# Patient Record
Sex: Male | Born: 1975 | Race: White | Hispanic: No | Marital: Single | Smoking: Never smoker
Health system: Southern US, Community
[De-identification: ages and names within clinical notes are randomized; demographics above are authoritative.]

## PROBLEM LIST (undated history)

## (undated) DIAGNOSIS — F419 Anxiety disorder, unspecified: Secondary | ICD-10-CM

## (undated) DIAGNOSIS — F32A Depression, unspecified: Secondary | ICD-10-CM

## (undated) DIAGNOSIS — I2699 Other pulmonary embolism without acute cor pulmonale: Secondary | ICD-10-CM

## (undated) DIAGNOSIS — F329 Major depressive disorder, single episode, unspecified: Secondary | ICD-10-CM

## (undated) HISTORY — DX: Anxiety disorder, unspecified: F41.9

## (undated) HISTORY — DX: Major depressive disorder, single episode, unspecified: F32.9

## (undated) HISTORY — DX: Depression, unspecified: F32.A

## (undated) HISTORY — DX: Other pulmonary embolism without acute cor pulmonale: I26.99

---

## 2010-09-07 ENCOUNTER — Emergency Department: Payer: Self-pay | Admitting: Unknown Physician Specialty

## 2011-02-19 ENCOUNTER — Encounter: Payer: Self-pay | Admitting: "Endocrinology

## 2011-03-06 ENCOUNTER — Encounter: Payer: Self-pay | Admitting: "Endocrinology

## 2011-04-05 ENCOUNTER — Encounter: Payer: Self-pay | Admitting: "Endocrinology

## 2012-06-11 ENCOUNTER — Ambulatory Visit: Payer: Self-pay | Admitting: Adult Health

## 2014-01-08 IMAGING — US US EXTREM LOW VENOUS*L*
1 series · 14 of 24 positions shown · non-contrast
Comparison: none

REASON FOR EXAM: STAT CallReport [id number]   LT calf pain eval DVT
COMMENTS:

[Series 1: us extrem low venous*left* · 0.13mm/px · 14 of 25 slices shown]
[im 1/25]
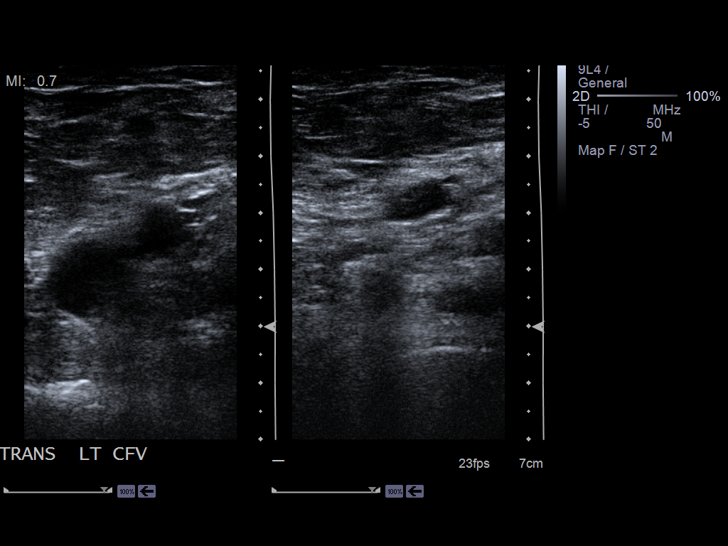
[im 3/25]
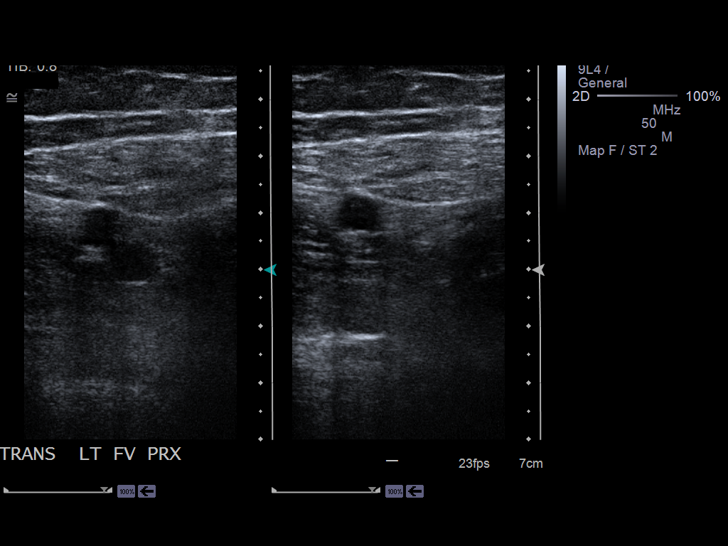
[im 5/25]
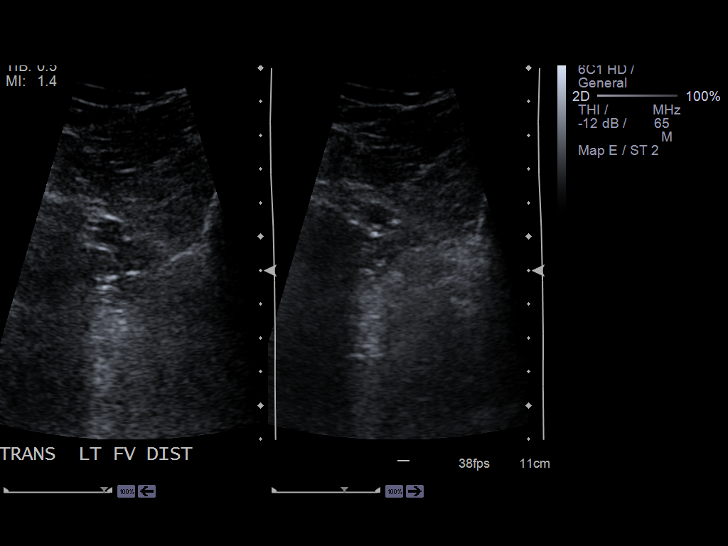
[im 7/25]
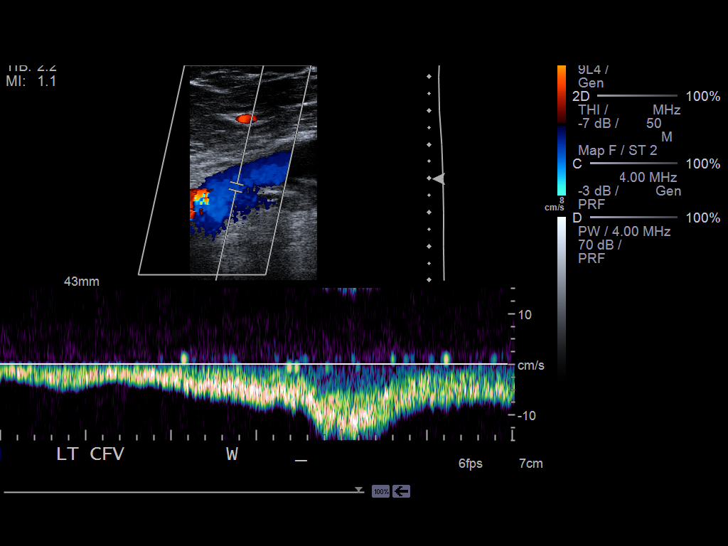
[im 8/25]
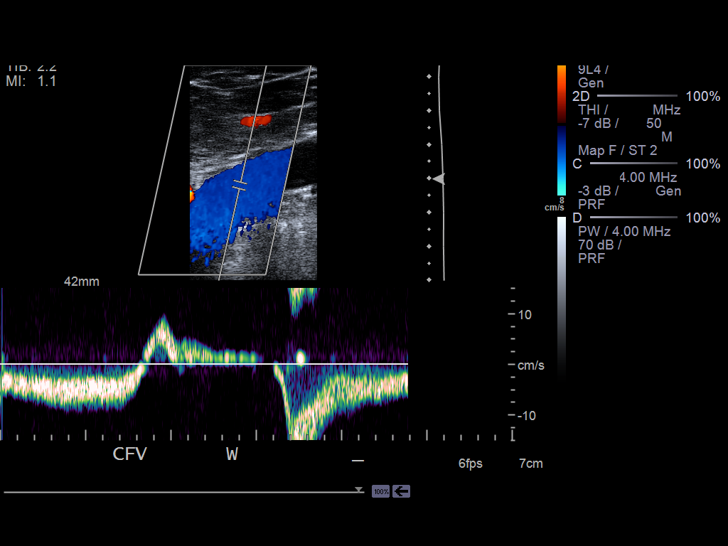
[im 10/25]
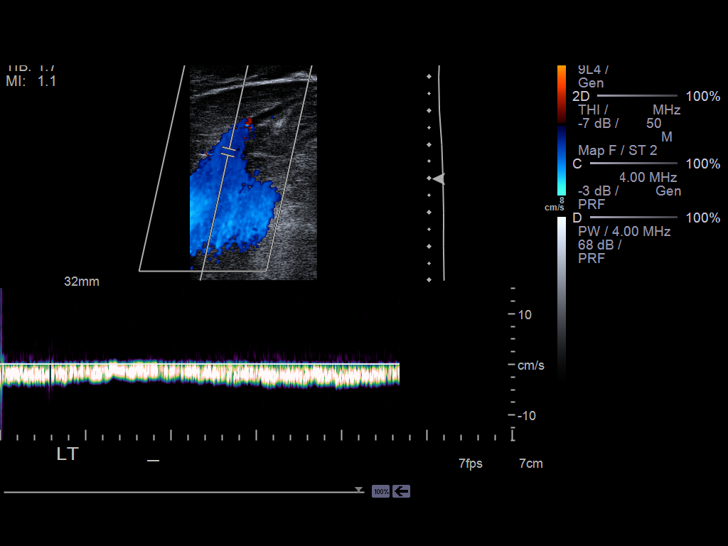
[im 12/25]
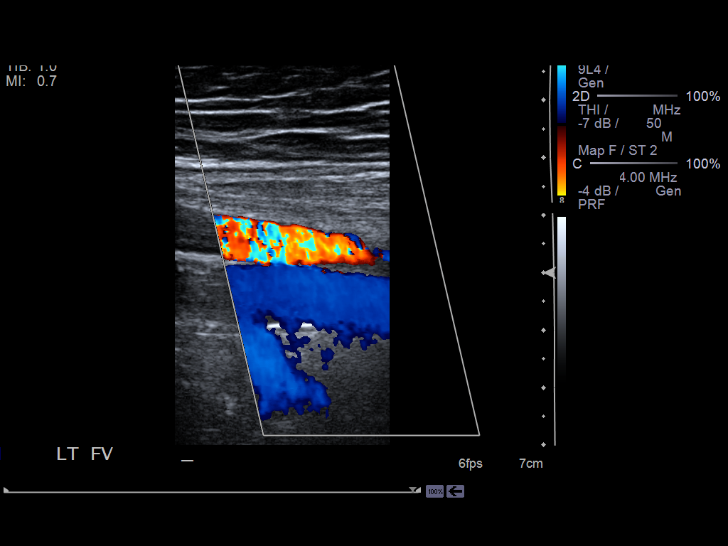
[im 13/25]
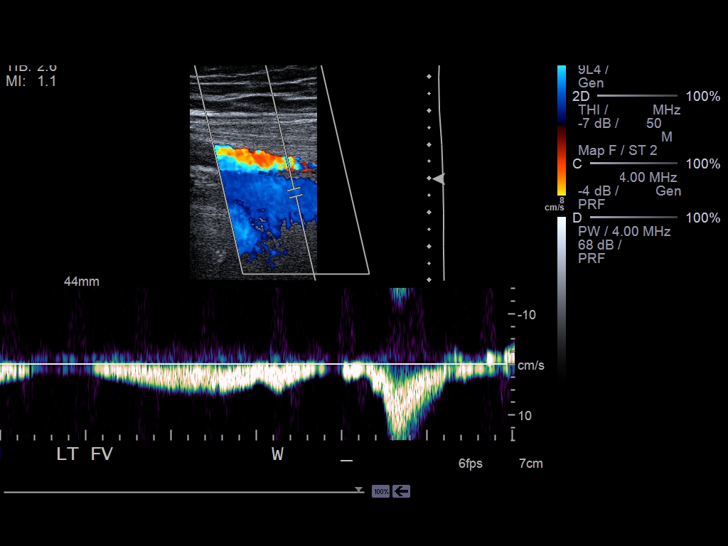
[im 15/25]
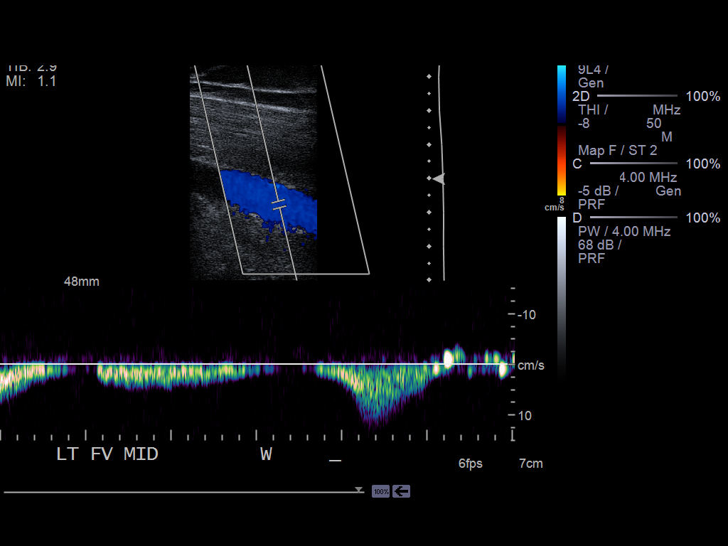
[im 17/25]
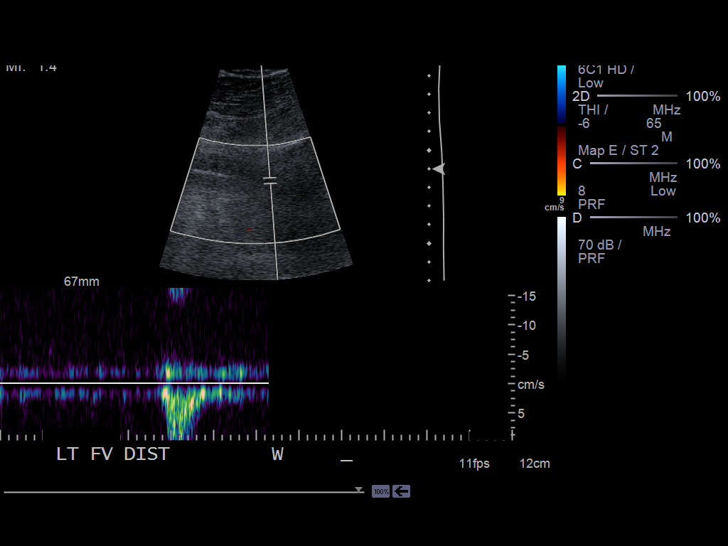
[im 19/25]
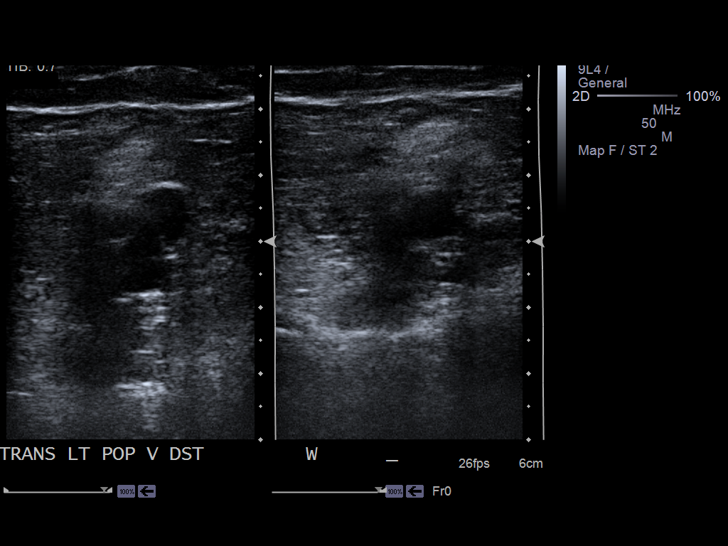
[im 20/25]
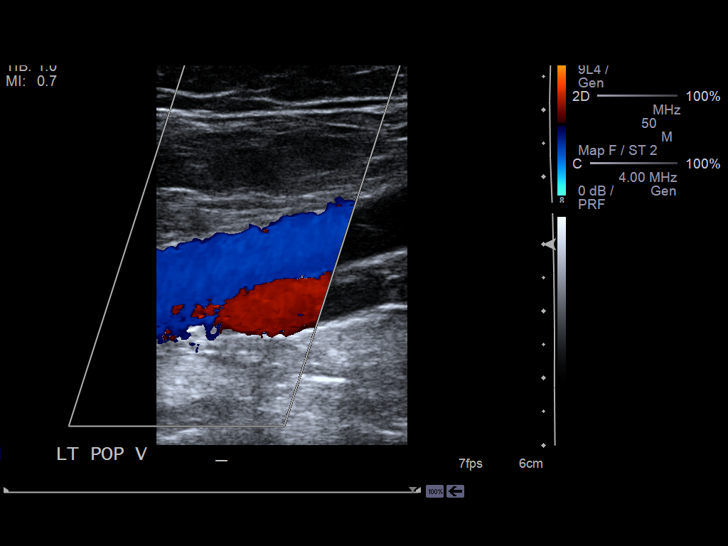
[im 22/25]
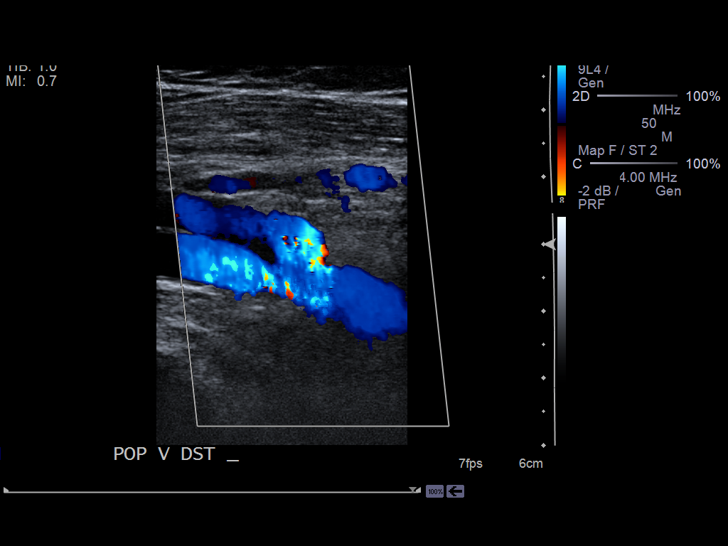
[im 25/25]
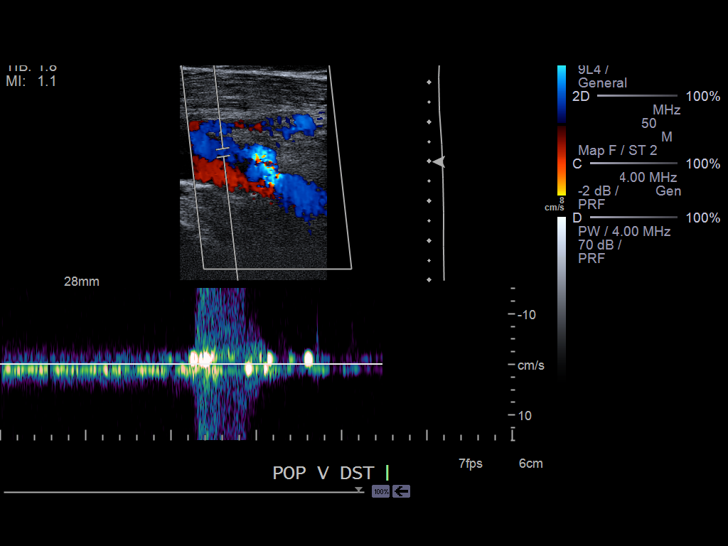

[14 of 24 positions shown; findings below may reference images not displayed]

PROCEDURE:     ALBUQUERQUE - ALBUQUERQUE DOPPLER VEINS LT LEG EXTR  - June 11, 2012  [DATE]

RESULT:     Grayscale and color flow Doppler techniques were employed to
evaluate the deep veins of the left lower extremity.

The left common femoral, superficial femoral, and popliteal veins are
normally compressible. The waveform patterns are normal and the color flow
images are normal. The response to the augmentation and Valsalva maneuvers
is normal.
IMPRESSION: There is no evidence of a thrombus in the left femoral or
popliteal veins.

[REDACTED]

## 2014-07-07 ENCOUNTER — Ambulatory Visit: Payer: Self-pay

## 2014-07-18 LAB — HEMOGLOBIN A1C: HEMOGLOBIN A1C: 5.8

## 2014-07-18 LAB — TSH: TSH: 2.37 u[IU]/mL (ref <=5.90)

## 2014-09-06 ENCOUNTER — Ambulatory Visit: Payer: Self-pay

## 2014-10-04 ENCOUNTER — Ambulatory Visit: Payer: Self-pay

## 2014-10-06 LAB — BASIC METABOLIC PANEL
BUN: 10 mg/dL (ref 4–21)
CREATININE: 0.8 mg/dL (ref 0.6–1.3)
Glucose: 108 mg/dL
Potassium: 4.3 mmol/L (ref 3.4–5.3)
Sodium: 137 mmol/L (ref 137–147)

## 2014-10-06 LAB — LIPID PANEL
CHOLESTEROL: 217 mg/dL — AB (ref 0–200)
HDL: 32 mg/dL — AB (ref 35–70)
LDL CALC: 125 mg/dL
Triglycerides: 299 mg/dL — AB (ref 40–160)

## 2014-10-06 LAB — CBC AND DIFFERENTIAL
HCT: 43 % (ref 41–53)
Hemoglobin: 15.6 g/dL (ref 13.5–17.5)
NEUTROS ABS: 3 /uL
PLATELETS: 284 10*3/uL (ref 150–399)
WBC: 5.1 10*3/mL

## 2014-10-06 LAB — HEPATIC FUNCTION PANEL
ALK PHOS: 86 U/L (ref 25–125)
ALT: 34 U/L (ref 10–40)
AST: 28 U/L (ref 14–40)
BILIRUBIN, TOTAL: 0.4 mg/dL

## 2014-10-11 ENCOUNTER — Ambulatory Visit: Payer: Self-pay | Admitting: Internal Medicine

## 2014-10-11 DIAGNOSIS — F329 Major depressive disorder, single episode, unspecified: Secondary | ICD-10-CM | POA: Insufficient documentation

## 2014-10-11 DIAGNOSIS — F32A Depression, unspecified: Secondary | ICD-10-CM | POA: Insufficient documentation

## 2014-10-11 DIAGNOSIS — I2782 Chronic pulmonary embolism: Secondary | ICD-10-CM | POA: Insufficient documentation

## 2014-11-08 ENCOUNTER — Ambulatory Visit: Payer: Self-pay

## 2014-11-22 ENCOUNTER — Ambulatory Visit: Payer: Self-pay

## 2014-12-20 ENCOUNTER — Ambulatory Visit: Payer: Self-pay

## 2014-12-27 ENCOUNTER — Ambulatory Visit: Payer: Self-pay

## 2015-01-31 ENCOUNTER — Ambulatory Visit: Payer: Self-pay

## 2015-02-28 ENCOUNTER — Ambulatory Visit: Payer: Self-pay

## 2015-04-04 ENCOUNTER — Ambulatory Visit: Payer: Self-pay

## 2015-04-11 ENCOUNTER — Other Ambulatory Visit: Payer: Self-pay

## 2015-04-18 ENCOUNTER — Ambulatory Visit: Payer: Self-pay | Admitting: Internal Medicine

## 2015-06-18 ENCOUNTER — Emergency Department
Admission: EM | Admit: 2015-06-18 | Discharge: 2015-06-18 | Disposition: A | Discharge status: Home / Self Care | Payer: Self-pay | Attending: Emergency Medicine | Admitting: Emergency Medicine

## 2015-06-18 ENCOUNTER — Encounter: Payer: Self-pay | Admitting: *Deleted

## 2015-06-18 DIAGNOSIS — Y998 Other external cause status: Secondary | ICD-10-CM | POA: Insufficient documentation

## 2015-06-18 DIAGNOSIS — Y92009 Unspecified place in unspecified non-institutional (private) residence as the place of occurrence of the external cause: Secondary | ICD-10-CM | POA: Insufficient documentation

## 2015-06-18 DIAGNOSIS — Y9389 Activity, other specified: Secondary | ICD-10-CM | POA: Insufficient documentation

## 2015-06-18 DIAGNOSIS — Z23 Encounter for immunization: Secondary | ICD-10-CM | POA: Insufficient documentation

## 2015-06-18 DIAGNOSIS — Y288XXA Contact with other sharp object, undetermined intent, initial encounter: Secondary | ICD-10-CM | POA: Insufficient documentation

## 2015-06-18 DIAGNOSIS — S61219A Laceration without foreign body of unspecified finger without damage to nail, initial encounter: Secondary | ICD-10-CM

## 2015-06-18 DIAGNOSIS — S61211A Laceration without foreign body of left index finger without damage to nail, initial encounter: Secondary | ICD-10-CM | POA: Insufficient documentation

## 2015-06-18 MED ORDER — BUPIVACAINE HCL 0.25 % IJ SOLN
30.0000 mL | Freq: Once | INTRAMUSCULAR | Status: DC
  Filled 2015-06-18: qty 30

## 2015-06-18 MED ORDER — LIDOCAINE HCL (PF) 1 % IJ SOLN
5.0000 mL | Freq: Once | INTRAMUSCULAR | Status: DC
  Filled 2015-06-18: qty 5

## 2015-06-18 MED ORDER — BUPIVACAINE HCL (PF) 0.25 % IJ SOLN
INTRAMUSCULAR | Status: AC
  Administered 2015-06-18: 30 mL
  Filled 2015-06-18: qty 30

## 2015-06-18 MED ORDER — TETANUS-DIPHTH-ACELL PERTUSSIS 5-2.5-18.5 LF-MCG/0.5 IM SUSP
0.5000 mL | Freq: Once | INTRAMUSCULAR | Status: AC
  Administered 2015-06-18: 0.5 mL via INTRAMUSCULAR
  Filled 2015-06-18: qty 0.5

## 2015-06-18 NOTE — Discharge Instructions (Signed)
Laceration Care, Adult  A laceration is a cut that goes through all layers of the skin. The cut also goes into the tissue that is right under the skin. Some cuts heal on their own. Others need to be closed with stitches (sutures), staples, skin adhesive strips, or wound glue. Taking care of your cut lowers your risk of infection and helps your cut to heal better.  HOW TO TAKE CARE OF YOUR CUT  For stitches or staples:  · Keep the wound clean and dry.  · If you were given a bandage (dressing), you should change it at least one time per day or as told by your doctor. You should also change it if it gets wet or dirty.  · Keep the wound completely dry for the first 24 hours or as told by your doctor. After that time, you may take a shower or a bath. However, make sure that the wound is not soaked in water until after the stitches or staples have been removed.  · Clean the wound one time each day or as told by your doctor:    Wash the wound with soap and water.    Rinse the wound with water until all of the soap comes off.    Pat the wound dry with a clean towel. Do not rub the wound.  · After you clean the wound, put a thin layer of antibiotic ointment on it as told by your doctor. This ointment:    Helps to prevent infection.    Keeps the bandage from sticking to the wound.  · Have your stitches or staples removed as told by your doctor.  If your doctor used skin adhesive strips:   · Keep the wound clean and dry.  · If you were given a bandage, you should change it at least one time per day or as told by your doctor. You should also change it if it gets dirty or wet.  · Do not get the skin adhesive strips wet. You can take a shower or a bath, but be careful to keep the wound dry.  · If the wound gets wet, pat it dry with a clean towel. Do not rub the wound.  · Skin adhesive strips fall off on their own. You can trim the strips as the wound heals. Do not remove any strips that are still stuck to the wound. They will  fall off after a while.  If your doctor used wound glue:  · Try to keep your wound dry, but you may briefly wet it in the shower or bath. Do not soak the wound in water, such as by swimming.  · After you take a shower or a bath, gently pat the wound dry with a clean towel. Do not rub the wound.  · Do not do any activities that will make you really sweaty until the skin glue has fallen off on its own.  · Do not apply liquid, cream, or ointment medicine to your wound while the skin glue is still on.  · If you were given a bandage, you should change it at least one time per day or as told by your doctor. You should also change it if it gets dirty or wet.  · If a bandage is placed over the wound, do not let the tape for the bandage touch the skin glue.  · Do not pick at the glue. The skin glue usually stays on for 5-10 days. Then, it   or when wound glue stays in place and the wound is healed. Make sure to wear a sunscreen of at least 30 SPF.  Take over-the-counter and prescription medicines only as told by your doctor.  If you were given antibiotic medicine or ointment, take or apply it as told by your doctor. Do not stop using the antibiotic even if your wound is getting better.  Do not scratch or pick at the wound.  Keep all follow-up visits as told by your doctor. This is important.  Check your wound every day for signs of infection. Watch for:  Redness, swelling, or pain.  Fluid, blood, or pus.  Raise (elevate) the injured area above the level of your heart while you are sitting or lying down, if possible. GET HELP IF:  You got a tetanus shot and you have any of these problems at the injection site:  Swelling.  Very bad pain.  Redness.  Bleeding.  You have a fever.  A wound that was  closed breaks open.  You notice a bad smell coming from your wound or your bandage.  You notice something coming out of the wound, such as wood or glass.  Medicine does not help your pain.  You have more redness, swelling, or pain at the site of your wound.  You have fluid, blood, or pus coming from your wound.  You notice a change in the color of your skin near your wound.  You need to change the bandage often because fluid, blood, or pus is coming from the wound.  You start to have a new rash.  You start to have numbness around the wound. GET HELP RIGHT AWAY IF:  You have very bad swelling around the wound.  Your pain suddenly gets worse and is very bad.  You notice painful lumps near the wound or on skin that is anywhere on your body.  You have a red streak going away from your wound.  The wound is on your hand or foot and you cannot move a finger or toe like you usually can.  The wound is on your hand or foot and you notice that your fingers or toes look pale or bluish.   This information is not intended to replace advice given to you by your health care provider. Make sure you discuss any questions you have with your health care provider.   Document Released: 10/08/2007 Document Revised: 09/05/2014 Document Reviewed: 04/17/2014 Elsevier Interactive Patient Education [employer]   Keep the wound clean, dry, and covered. Follow-up with your provider in 7-10 days for suture removal.

## 2015-06-18 NOTE — ED Notes (Signed)
Pt has laceration to left index finger.  Cut self with a razor.  Bleeding controlled.

## 2015-06-18 NOTE — ED Provider Notes (Signed)
[facility] Emergency Department Provider Note ____________________________________________  Time seen: 2103  I have reviewed the triage vital signs and the nursing notes.  HISTORY  Chief Complaint  Laceration  HPI Ronald Montoya is a 40 y.o. male presents to the ED for evaluation of a laceration to left index finger. He describes actually cut top of his left finger with a drywall razor while working at home today. The bleeding is currently controlled but he is concerned because he is on blood thinners regularly. He denies any other injury at this time. He is unclear of his tetanus status.  No past medical history on file.  There are no active problems to display for this patient.  No past surgical history on file.  No current outpatient prescriptions on file.  Allergies Review of patient's allergies indicates no known allergies.  No family history on file.  Social History Social History  Substance Use Topics  . Smoking status: Never Smoker   . Smokeless tobacco: None  . Alcohol Use: No   Review of Systems  Constitutional: Negative for fever. Musculoskeletal: Negative for back pain. Skin: Negative for rash. Reports lac as above Neurological: Negative for headaches, focal weakness or numbness. ____________________________________________  PHYSICAL EXAM:  VITAL SIGNS: ED Triage Vitals  Enc Vitals Group     BP 06/18/15 2008 125/81 mmHg     Pulse Rate 06/18/15 2008 79     Resp 06/18/15 2008 20     Temp 06/18/15 2008 99 F (37.2 C)     Temp Source 06/18/15 2008 Oral     SpO2 06/18/15 2008 98 %     Weight 06/18/15 2008 255 lb (115.667 kg)     Height 06/18/15 2008  (1.88 m)     Head Cir --      Peak Flow --      Pain Score 06/18/15 2010 6     Pain Loc --      Pain Edu? --      Excl. in GC? --    Constitutional: Alert and oriented. Well appearing and in no distress. Head: Normocephalic and atraumatic. Cardiovascular: Normal rate,  regular rhythm. Normal distal pulses Respiratory: Normal respiratory effort.  Musculoskeletal: Left index finger with superficial flap laceration distally. Normal composite fist. Nontender with normal range of motion in all extremities.  Neurologic:  CN II-XII grossly intact. Normal gross sensation. Normal gait without ataxia. Normal speech and language. No gross focal neurologic deficits are appreciated. Skin:  Skin is warm, dry and intact. No rash noted. Psychiatric: Mood and affect are normal. Patient exhibits appropriate insight and judgment. ____________________________________________  PROCEDURES  Tdap  LACERATION REPAIR Performed by: Lissa Hoard Authorized by: Lissa Hoard Consent: Verbal consent obtained. Risks and benefits: risks, benefits and alternatives were discussed Consent given by: patient Patient identity confirmed: provided demographic data Prepped and Draped in normal sterile fashion Wound explored  Laceration Location: left index finger  Laceration Length: 2 cm flap  No Foreign Bodies seen or palpated  Anesthesia: local & digital infiltration  Local anesthetic: 1:1 lidocaine 1% : 0.25% bupivicaine  Anesthetic total: 3 ml  Irrigation method: syringe Amount of cleaning: standard  Skin closure: 5-0 nylon  Number of sutures: 7  Technique: interrupted  Patient tolerance: Patient tolerated the procedure well with no immediate complications. ____________________________________________  INITIAL IMPRESSION / ASSESSMENT AND PLAN / ED COURSE  Patient with a superficial laceration to left hand has post suture repair. Wound care structures are  provided. He is to follow-up with his primary care provider in 7-10 days for suture removal. He is encouraged to double clean, dry, and covered. ____________________________________________  FINAL CLINICAL IMPRESSION(S) / ED DIAGNOSES  Final diagnoses:  Laceration of finger of left hand,  initial encounter      Lissa Hoard, PA-C 06/18/15 2339  Minna Antis, MD 06/18/15 2358

## 2015-06-28 ENCOUNTER — Ambulatory Visit: Payer: Self-pay

## 2015-06-29 DIAGNOSIS — F329 Major depressive disorder, single episode, unspecified: Secondary | ICD-10-CM

## 2015-06-29 DIAGNOSIS — I2782 Chronic pulmonary embolism: Secondary | ICD-10-CM

## 2015-06-29 DIAGNOSIS — F32A Depression, unspecified: Secondary | ICD-10-CM

## 2015-07-04 ENCOUNTER — Telehealth: Payer: Self-pay

## 2015-07-04 NOTE — Telephone Encounter (Signed)
Received faxed refill request for Coumadin 7.5 mg take as directed. Record reflects Coumadin 10 mg.

## 2015-07-04 NOTE — Telephone Encounter (Signed)
Patient wants to reschedule a missed appointment. [phone]

## 2015-07-04 NOTE — Telephone Encounter (Signed)
His information is not available in the EPIC system yet, so I cannot answer the question regarding his Coumadin dose.  I don't want him without any anticoagulation therapy, so I would refill the 7.5 mg dose.  Has he rescheduled his missed appointment?

## 2015-07-05 NOTE — Telephone Encounter (Signed)
Called patient and made him an appointment for 07/25/15 told him he needs to keep this appointment to receive further refills.

## 2015-07-11 ENCOUNTER — Ambulatory Visit: Payer: Self-pay | Admitting: Internal Medicine

## 2015-07-17 ENCOUNTER — Other Ambulatory Visit: Payer: Self-pay

## 2015-07-17 ENCOUNTER — Ambulatory Visit: Payer: Self-pay

## 2015-07-24 ENCOUNTER — Telehealth: Payer: Self-pay

## 2015-07-24 NOTE — Telephone Encounter (Signed)
Called to reschedule appointment and Ronald Montoya is out of his Warfarin 7.5 mg. [facility] to [facility] [place]. Patient to come in on 08/08/15.

## 2015-07-25 ENCOUNTER — Ambulatory Visit: Payer: Self-pay | Admitting: Internal Medicine

## 2015-08-02 ENCOUNTER — Other Ambulatory Visit: Payer: Self-pay

## 2015-08-03 MED ORDER — WARFARIN SODIUM 10 MG PO TABS: 10.0000 mg | ORAL_TABLET | Freq: Every day | ORAL | Status: DC

## 2015-08-03 NOTE — Telephone Encounter (Signed)
We continue to receive refill request for Ronald Montoya and need to refill if possible.

## 2015-08-08 ENCOUNTER — Ambulatory Visit: Payer: Self-pay | Admitting: Internal Medicine

## 2015-08-09 NOTE — Telephone Encounter (Signed)
tried to call to reschedule missed coumadin clinic  appt called [phone] busy x's 2

## 2015-08-10 NOTE — Telephone Encounter (Signed)
Tried to call to reschedule missed PT/INR appt   Busy x's 2  [phone]

## 2015-08-14 NOTE — Telephone Encounter (Signed)
Lm to reschedule missed PT/INR appt [phone]

## 2015-08-16 ENCOUNTER — Telehealth: Payer: Self-pay

## 2015-08-16 NOTE — Telephone Encounter (Signed)
[facility] to [facility], not able to authorize refill . [facility] , he must complete eligibility to bee seen. [facility] to [facility].

## 2016-08-13 ENCOUNTER — Other Ambulatory Visit: Payer: Self-pay | Admitting: Urology

## 2022-05-14 DIAGNOSIS — F32A Depression, unspecified: Secondary | ICD-10-CM | POA: Diagnosis not present

## 2022-05-14 DIAGNOSIS — Z23 Encounter for immunization: Secondary | ICD-10-CM | POA: Diagnosis not present

## 2022-12-16 DIAGNOSIS — M5441 Lumbago with sciatica, right side: Secondary | ICD-10-CM | POA: Diagnosis not present

## 2022-12-16 DIAGNOSIS — M25512 Pain in left shoulder: Secondary | ICD-10-CM | POA: Diagnosis not present

## 2022-12-16 DIAGNOSIS — F32A Depression, unspecified: Secondary | ICD-10-CM | POA: Diagnosis not present

## 2022-12-16 DIAGNOSIS — F4010 Social phobia, unspecified: Secondary | ICD-10-CM | POA: Diagnosis not present

## 2022-12-16 DIAGNOSIS — M5442 Lumbago with sciatica, left side: Secondary | ICD-10-CM | POA: Diagnosis not present

## 2022-12-16 DIAGNOSIS — G8929 Other chronic pain: Secondary | ICD-10-CM | POA: Diagnosis not present

## 2022-12-30 DIAGNOSIS — F4010 Social phobia, unspecified: Secondary | ICD-10-CM | POA: Diagnosis not present

## 2022-12-30 DIAGNOSIS — F32A Depression, unspecified: Secondary | ICD-10-CM | POA: Diagnosis not present

## 2023-01-14 DIAGNOSIS — M5442 Lumbago with sciatica, left side: Secondary | ICD-10-CM | POA: Diagnosis not present

## 2023-01-14 DIAGNOSIS — M5441 Lumbago with sciatica, right side: Secondary | ICD-10-CM | POA: Diagnosis not present

## 2023-01-14 DIAGNOSIS — G8929 Other chronic pain: Secondary | ICD-10-CM | POA: Diagnosis not present

## 2023-01-14 DIAGNOSIS — M47816 Spondylosis without myelopathy or radiculopathy, lumbar region: Secondary | ICD-10-CM | POA: Diagnosis not present

## 2023-01-15 DIAGNOSIS — F32A Depression, unspecified: Secondary | ICD-10-CM | POA: Diagnosis not present

## 2023-01-15 DIAGNOSIS — Z23 Encounter for immunization: Secondary | ICD-10-CM | POA: Diagnosis not present

## 2023-01-15 DIAGNOSIS — Z1159 Encounter for screening for other viral diseases: Secondary | ICD-10-CM | POA: Diagnosis not present

## 2023-01-15 DIAGNOSIS — Z0000 Encounter for general adult medical examination without abnormal findings: Secondary | ICD-10-CM | POA: Diagnosis not present

## 2023-01-15 DIAGNOSIS — M549 Dorsalgia, unspecified: Secondary | ICD-10-CM | POA: Diagnosis not present

## 2023-01-27 DIAGNOSIS — F32A Depression, unspecified: Secondary | ICD-10-CM | POA: Diagnosis not present

## 2023-02-03 DIAGNOSIS — N281 Cyst of kidney, acquired: Secondary | ICD-10-CM | POA: Diagnosis not present

## 2023-02-03 DIAGNOSIS — M4808 Spinal stenosis, sacral and sacrococcygeal region: Secondary | ICD-10-CM | POA: Diagnosis not present

## 2023-02-03 DIAGNOSIS — M5136 Other intervertebral disc degeneration, lumbar region with discogenic back pain only: Secondary | ICD-10-CM | POA: Diagnosis not present

## 2023-02-03 DIAGNOSIS — M5127 Other intervertebral disc displacement, lumbosacral region: Secondary | ICD-10-CM | POA: Diagnosis not present

## 2023-02-11 DIAGNOSIS — M5416 Radiculopathy, lumbar region: Secondary | ICD-10-CM | POA: Diagnosis not present

## 2023-02-11 DIAGNOSIS — M47816 Spondylosis without myelopathy or radiculopathy, lumbar region: Secondary | ICD-10-CM | POA: Diagnosis not present

## 2023-02-16 DIAGNOSIS — L729 Follicular cyst of the skin and subcutaneous tissue, unspecified: Secondary | ICD-10-CM | POA: Diagnosis not present

## 2023-02-19 DIAGNOSIS — F419 Anxiety disorder, unspecified: Secondary | ICD-10-CM | POA: Diagnosis not present

## 2023-02-19 DIAGNOSIS — F332 Major depressive disorder, recurrent severe without psychotic features: Secondary | ICD-10-CM | POA: Diagnosis not present

## 2023-03-09 DIAGNOSIS — F332 Major depressive disorder, recurrent severe without psychotic features: Secondary | ICD-10-CM | POA: Diagnosis not present

## 2023-03-10 DIAGNOSIS — F329 Major depressive disorder, single episode, unspecified: Secondary | ICD-10-CM | POA: Diagnosis not present

## 2023-03-10 DIAGNOSIS — F4010 Social phobia, unspecified: Secondary | ICD-10-CM | POA: Diagnosis not present

## 2023-03-24 DIAGNOSIS — F332 Major depressive disorder, recurrent severe without psychotic features: Secondary | ICD-10-CM | POA: Diagnosis not present

## 2023-03-25 DIAGNOSIS — M47812 Spondylosis without myelopathy or radiculopathy, cervical region: Secondary | ICD-10-CM | POA: Diagnosis not present

## 2023-03-31 DIAGNOSIS — M545 Low back pain, unspecified: Secondary | ICD-10-CM | POA: Diagnosis not present

## 2023-03-31 DIAGNOSIS — N6324 Unspecified lump in the left breast, lower inner quadrant: Secondary | ICD-10-CM | POA: Diagnosis not present

## 2023-03-31 DIAGNOSIS — F32A Depression, unspecified: Secondary | ICD-10-CM | POA: Diagnosis not present

## 2023-04-28 DIAGNOSIS — F332 Major depressive disorder, recurrent severe without psychotic features: Secondary | ICD-10-CM | POA: Diagnosis not present

## 2023-06-08 DIAGNOSIS — M47816 Spondylosis without myelopathy or radiculopathy, lumbar region: Secondary | ICD-10-CM | POA: Diagnosis not present

## 2023-06-08 DIAGNOSIS — M4800 Spinal stenosis, site unspecified: Secondary | ICD-10-CM | POA: Diagnosis not present

## 2023-06-08 DIAGNOSIS — M5416 Radiculopathy, lumbar region: Secondary | ICD-10-CM | POA: Diagnosis not present

## 2023-06-29 DIAGNOSIS — F332 Major depressive disorder, recurrent severe without psychotic features: Secondary | ICD-10-CM | POA: Diagnosis not present

## 2023-07-08 DIAGNOSIS — N6002 Solitary cyst of left breast: Secondary | ICD-10-CM | POA: Diagnosis not present

## 2023-07-20 DIAGNOSIS — F332 Major depressive disorder, recurrent severe without psychotic features: Secondary | ICD-10-CM | POA: Diagnosis not present

## 2023-07-20 DIAGNOSIS — F329 Major depressive disorder, single episode, unspecified: Secondary | ICD-10-CM | POA: Diagnosis not present

## 2023-07-20 DIAGNOSIS — I2782 Chronic pulmonary embolism: Secondary | ICD-10-CM | POA: Diagnosis not present

## 2023-08-10 DIAGNOSIS — F332 Major depressive disorder, recurrent severe without psychotic features: Secondary | ICD-10-CM | POA: Diagnosis not present

## 2023-08-19 DIAGNOSIS — M5416 Radiculopathy, lumbar region: Secondary | ICD-10-CM | POA: Diagnosis not present

## 2023-09-08 DIAGNOSIS — F332 Major depressive disorder, recurrent severe without psychotic features: Secondary | ICD-10-CM | POA: Diagnosis not present

## 2023-10-06 DIAGNOSIS — F332 Major depressive disorder, recurrent severe without psychotic features: Secondary | ICD-10-CM | POA: Diagnosis not present

## 2023-10-28 DIAGNOSIS — F332 Major depressive disorder, recurrent severe without psychotic features: Secondary | ICD-10-CM | POA: Diagnosis not present

## 2023-10-29 DIAGNOSIS — F332 Major depressive disorder, recurrent severe without psychotic features: Secondary | ICD-10-CM | POA: Diagnosis not present

## 2023-11-02 DIAGNOSIS — F332 Major depressive disorder, recurrent severe without psychotic features: Secondary | ICD-10-CM | POA: Diagnosis not present

## 2023-11-04 DIAGNOSIS — F332 Major depressive disorder, recurrent severe without psychotic features: Secondary | ICD-10-CM | POA: Diagnosis not present

## 2023-11-09 DIAGNOSIS — F332 Major depressive disorder, recurrent severe without psychotic features: Secondary | ICD-10-CM | POA: Diagnosis not present

## 2024-04-09 ENCOUNTER — Emergency Department
Admission: EM | Admit: 2024-04-09 | Discharge: 2024-04-09 | Disposition: A | Discharge status: Home / Self Care | Payer: MEDICAID | Attending: Emergency Medicine | Admitting: Emergency Medicine

## 2024-04-09 ENCOUNTER — Emergency Department: Payer: MEDICAID

## 2024-04-09 ENCOUNTER — Other Ambulatory Visit: Payer: Self-pay

## 2024-04-09 DIAGNOSIS — D696 Thrombocytopenia, unspecified: Secondary | ICD-10-CM | POA: Insufficient documentation

## 2024-04-09 DIAGNOSIS — I2699 Other pulmonary embolism without acute cor pulmonale: Secondary | ICD-10-CM | POA: Insufficient documentation

## 2024-04-09 DIAGNOSIS — Z7901 Long term (current) use of anticoagulants: Secondary | ICD-10-CM | POA: Insufficient documentation

## 2024-04-09 DIAGNOSIS — R0602 Shortness of breath: Secondary | ICD-10-CM | POA: Insufficient documentation

## 2024-04-09 DIAGNOSIS — R002 Palpitations: Secondary | ICD-10-CM | POA: Insufficient documentation

## 2024-04-09 DIAGNOSIS — D649 Anemia, unspecified: Secondary | ICD-10-CM | POA: Insufficient documentation

## 2024-04-09 DIAGNOSIS — D61818 Other pancytopenia: Secondary | ICD-10-CM

## 2024-04-09 DIAGNOSIS — D72819 Decreased white blood cell count, unspecified: Secondary | ICD-10-CM | POA: Insufficient documentation

## 2024-04-09 LAB — CBC WITH DIFFERENTIAL/PLATELET
Abs Immature Granulocytes: 0.02 K/uL (ref 0.00–0.07)
Basophils Absolute: 0 K/uL (ref 0.0–0.1)
Basophils Relative: 1 %
Eosinophils Absolute: 0 K/uL (ref 0.0–0.5)
Eosinophils Relative: 0 %
HCT: 23 % — ABNORMAL LOW (ref 39.0–52.0)
Hemoglobin: 8.3 g/dL — ABNORMAL LOW (ref 13.0–17.0)
Immature Granulocytes: 1 %
Lymphocytes Relative: 69 %
Lymphs Abs: 1.4 K/uL (ref 0.7–4.0)
MCH: 30.4 pg (ref 26.0–34.0)
MCHC: 36.1 g/dL — ABNORMAL HIGH (ref 30.0–36.0)
MCV: 84.2 fL (ref 80.0–100.0)
Monocytes Absolute: 0.3 K/uL (ref 0.1–1.0)
Monocytes Relative: 13 %
Neutro Abs: 0.3 K/uL — CL (ref 1.7–7.7)
Neutrophils Relative %: 16 %
Platelets: 101 K/uL — ABNORMAL LOW (ref 150–400)
RBC: 2.73 MIL/uL — ABNORMAL LOW (ref 4.22–5.81)
RDW: 13.4 % (ref 11.5–15.5)
WBC: 1.9 K/uL — ABNORMAL LOW (ref 4.0–10.5)
nRBC: 0 % (ref 0.0–0.2)

## 2024-04-09 LAB — CBC
HCT: 24.5 % — ABNORMAL LOW (ref 39.0–52.0)
Hemoglobin: 8.9 g/dL — ABNORMAL LOW (ref 13.0–17.0)
MCH: 30.4 pg (ref 26.0–34.0)
MCHC: 36.3 g/dL — ABNORMAL HIGH (ref 30.0–36.0)
MCV: 83.6 fL (ref 80.0–100.0)
Platelets: 105 K/uL — ABNORMAL LOW (ref 150–400)
RBC: 2.93 MIL/uL — ABNORMAL LOW (ref 4.22–5.81)
RDW: 13.3 % (ref 11.5–15.5)
WBC: 2 K/uL — ABNORMAL LOW (ref 4.0–10.5)
nRBC: 0 % (ref 0.0–0.2)

## 2024-04-09 LAB — BASIC METABOLIC PANEL WITH GFR
Anion gap: 11 (ref 5–15)
BUN: 19 mg/dL (ref 6–20)
CO2: 27 mmol/L (ref 22–32)
Calcium: 9 mg/dL (ref 8.9–10.3)
Chloride: 101 mmol/L (ref 98–111)
Creatinine, Ser: 1.05 mg/dL (ref 0.61–1.24)
GFR, Estimated: >60 mL/min (ref >60)
Glucose, Bld: 85 mg/dL (ref 70–99)
Potassium: 4.1 mmol/L (ref 3.5–5.1)
Sodium: 139 mmol/L (ref 135–145)

## 2024-04-09 LAB — TSH: TSH: 4.8 u[IU]/mL — ABNORMAL HIGH (ref 0.350–4.500)

## 2024-04-09 LAB — D-DIMER, QUANTITATIVE: D-Dimer, Quant: 1.51 ug{FEU}/mL — ABNORMAL HIGH (ref 0.00–0.50)

## 2024-04-09 LAB — T4, FREE: Free T4: 1.12 ng/dL (ref 0.61–1.12)

## 2024-04-09 LAB — TROPONIN T, HIGH SENSITIVITY: Troponin T High Sensitivity: <15 ng/L (ref 0–19)

## 2024-04-09 LAB — LACTIC ACID, PLASMA: Lactic Acid, Venous: 1.2 mmol/L (ref 0.5–1.9)

## 2024-04-09 MED ORDER — IOHEXOL 350 MG/ML SOLN
75.0000 mL | Freq: Once | INTRAVENOUS | Status: AC | PRN
  Administered 2024-04-09: 75 mL via INTRAVENOUS

## 2024-04-09 NOTE — ED Provider Notes (Signed)
 Procedures     ----------------------------------------- 4:24 PM on 04/09/2024 -----------------------------------------  Repeat CBC confirms pancytopenia.  ANC is 300.  No infectious symptoms.  Normal vital signs.  Will obtain blood culture as a screening measure.  Discussed with hematology Dr. Jacobo who agrees with outpatient follow-up, cancer center will call the patient on Monday to schedule appointment.    Viviann Pastor, MD 04/09/24 [phone]

## 2024-04-09 NOTE — ED Provider Notes (Signed)
 [facility] Provider Note    Event Date/Time   First MD Initiated Contact with Patient 04/09/24 1222     (approximate)   History   Chief Complaint Palpitations (/)   HPI  Ronald Montoya is a 48 y.o. male with past medical history of PE on Xarelto who presents to the ED complaining of palpitations.  Patient reports that he to the past 2 mornings he has woken up with a feeling of pounding in the chest along with some dizziness and shortness of breath.  He denies associated pain in his chest and has not had any fever or cough.  Symptoms have lasted for a few hours from the time that he woke up each for the past 2 days, after which they resolved.  Since arriving to the ED, his symptoms have completely resolved and he states he feels back to normal.  He has not noticed any pain or swelling in his legs, states he has not missed any doses of his Xarelto.     Physical Exam   Triage Vital Signs: ED Triage Vitals  Encounter Vitals Group     BP 04/09/24 1211 118/84     Girls Systolic BP Percentile --      Girls Diastolic BP Percentile --      Boys Systolic BP Percentile --      Boys Diastolic BP Percentile --      Pulse Rate 04/09/24 1211 82     Resp 04/09/24 1211 18     Temp 04/09/24 1211 98 F (36.7 C)     Temp Source 04/09/24 1211 Oral     SpO2 04/09/24 1211 98 %     Weight 04/09/24 1213 280 lb (127 kg)     Height 04/09/24 1213 6' 2 (1.88 m)     Head Circumference --      Peak Flow --      Pain Score 04/09/24 1213 0     Pain Loc --      Pain Education --      Exclude from Growth Chart --     Most recent vital signs: Vitals:   04/09/24 1211  BP: 118/84  Pulse: 82  Resp: 18  Temp: 98 F (36.7 C)  SpO2: 98%    Constitutional: Alert and oriented. Eyes: Conjunctivae are normal. Head: Atraumatic. Nose: No congestion/rhinnorhea. Mouth/Throat: Mucous membranes are moist.  Cardiovascular: Normal rate, regular rhythm. Grossly normal heart sounds.   2+ radial pulses bilaterally. Respiratory: Normal respiratory effort.  No retractions. Lungs CTAB. Gastrointestinal: Soft and nontender. No distention. Musculoskeletal: No lower extremity tenderness nor edema.  Neurologic:  Normal speech and language. No gross focal neurologic deficits are appreciated.    ED Results / Procedures / Treatments   Labs (all labs ordered are listed, but only abnormal results are displayed) Labs Reviewed  CBC - Abnormal; Notable for the following components:      Result Value   WBC 2.0 (*)    RBC 2.93 (*)    Hemoglobin 8.9 (*)    HCT 24.5 (*)    MCHC 36.3 (*)    Platelets 105 (*)    All other components within normal limits  D-DIMER, QUANTITATIVE - Abnormal; Notable for the following components:   D-Dimer, Quant 1.51 (*)    All other components within normal limits  TSH - Abnormal; Notable for the following components:   TSH 4.800 (*)    All other components within normal limits  BASIC METABOLIC PANEL  WITH GFR  T4, FREE  CBC WITH DIFFERENTIAL/PLATELET  TROPONIN T, HIGH SENSITIVITY     EKG  ED ECG REPORT I, Carlin Palin, the attending physician, personally viewed and interpreted this ECG.   Date: 04/09/2024  EKG Time: 12:16  Rate: 94  Rhythm: normal sinus rhythm  Axis: Normal  Intervals:none  ST&T Change: None  RADIOLOGY Chest x-ray reviewed and interpreted by me with no infiltrate, edema, or effusion.  PROCEDURES:  Critical Care performed: No  Procedures   MEDICATIONS ORDERED IN ED: Medications  iohexol  (OMNIPAQUE ) 350 MG/ML injection 75 mL (75 mLs Intravenous Contrast Given 04/09/24 1425)     IMPRESSION / MDM / ASSESSMENT AND PLAN / ED COURSE  I reviewed the triage vital signs and the nursing notes.                              48 y.o. male with past medical history of PE on Xarelto who presents to the ED complaining of palpitations, shortness of breath, and dizziness since waking up each of the past 2 mornings,  resolving after a couple of hours.  Patient's presentation is most consistent with acute presentation with potential threat to life or bodily function.  Differential diagnosis includes, but is not limited to, ACS, arrhythmia, PE, pneumonia, pneumothorax, hyperthyroidism, anemia, electrolyte abnormality, anxiety.  Patient nontoxic-appearing and in no acute distress, vital signs are unremarkable.  EKG shows no evidence of arrhythmia or ischemia, will observe on cardiac monitor for possible arrhythmia.  Labs and chest x-ray are pending at this time, will check D-dimer to rule out PE, although relatively low suspicion for this as patient has been compliant with Xarelto and has no symptoms currently.  He has no focal neurologic deficits and describes dizziness as a lightheadedness, doubt stroke or other intracranial process.  D-dimer elevated but CTA chest is negative for PE or other acute finding.  Additional labs without significant electrolyte abnormality or AKI, troponin and thyroid  function are unremarkable.  Initial lab draw did show leukopenia, anemia, and thrombocytopenia which were not present on labs about 1 year ago.  We will repeat CBC to confirm this but no evidence of bleeding or infection at this time, patient remains asymptomatic on reassessment.  If this is confirmed, then patient would be appropriate for outpatient follow-up with hematology.  Patient turned over to oncoming provider pending repeat CBC.      FINAL CLINICAL IMPRESSION(S) / ED DIAGNOSES   Final diagnoses:  Palpitations  Shortness of breath     Rx / DC Orders   ED Discharge Orders          Ordered    Ambulatory referral to Cardiology        04/09/24 1514             Note:  This document was prepared using Dragon voice recognition software and may include unintentional dictation errors.   Palin Carlin, MD 04/09/24 [phone]

## 2024-04-09 NOTE — Discharge Instructions (Addendum)
 Your evaluation today showed low blood counts of your white blood cells, platelets, and red blood cells.  This may be due to your lurasidone medication.  Expect a call from the Cancer Center on Monday, who will schedule an appointment for you to see the hematologist (blood disorder specialist) for further evaluation.

## 2024-04-09 NOTE — ED Triage Notes (Signed)
 Pt states the past couple days he's been waking up with SHOB, dizziness, cold sweats, palpitations. Pt denies pain or pressure. Pt denies N/V/D. Pt denies cardiac history.

## 2024-04-11 LAB — PATHOLOGIST SMEAR REVIEW

## 2024-04-14 LAB — CULTURE, BLOOD (ROUTINE X 2)
Culture: NO GROWTH
Culture: NO GROWTH
Special Requests: ADEQUATE
Special Requests: ADEQUATE

## 2024-04-18 ENCOUNTER — Inpatient Hospital Stay: Payer: MEDICAID

## 2024-04-18 ENCOUNTER — Other Ambulatory Visit: Payer: Self-pay

## 2024-04-18 ENCOUNTER — Inpatient Hospital Stay
Admission: EM | Admit: 2024-04-18 | Discharge: 2024-04-20 | DRG: 809 | Payer: MEDICAID | Attending: Family Medicine | Admitting: Family Medicine

## 2024-04-18 ENCOUNTER — Emergency Department: Payer: MEDICAID

## 2024-04-18 ENCOUNTER — Inpatient Hospital Stay: Payer: MEDICAID | Admitting: Oncology

## 2024-04-18 DIAGNOSIS — Z538 Procedure and treatment not carried out for other reasons: Secondary | ICD-10-CM | POA: Diagnosis present

## 2024-04-18 DIAGNOSIS — G8929 Other chronic pain: Secondary | ICD-10-CM | POA: Diagnosis present

## 2024-04-18 DIAGNOSIS — Z79899 Other long term (current) drug therapy: Secondary | ICD-10-CM | POA: Diagnosis not present

## 2024-04-18 DIAGNOSIS — D649 Anemia, unspecified: Principal | ICD-10-CM

## 2024-04-18 DIAGNOSIS — F32A Depression, unspecified: Secondary | ICD-10-CM | POA: Diagnosis present

## 2024-04-18 DIAGNOSIS — F419 Anxiety disorder, unspecified: Secondary | ICD-10-CM | POA: Diagnosis present

## 2024-04-18 DIAGNOSIS — D61818 Other pancytopenia: Secondary | ICD-10-CM | POA: Diagnosis present

## 2024-04-18 DIAGNOSIS — Z86711 Personal history of pulmonary embolism: Secondary | ICD-10-CM | POA: Diagnosis not present

## 2024-04-18 DIAGNOSIS — Z7901 Long term (current) use of anticoagulants: Secondary | ICD-10-CM

## 2024-04-18 DIAGNOSIS — C9500 Acute leukemia of unspecified cell type not having achieved remission: Secondary | ICD-10-CM | POA: Diagnosis present

## 2024-04-18 DIAGNOSIS — Z1152 Encounter for screening for COVID-19: Secondary | ICD-10-CM

## 2024-04-18 LAB — CBC WITH DIFFERENTIAL/PLATELET
Abs Immature Granulocytes: 0.01 K/uL (ref 0.00–0.07)
Basophils Absolute: 0 K/uL (ref 0.0–0.1)
Basophils Relative: 0 %
Eosinophils Absolute: 0 K/uL (ref 0.0–0.5)
Eosinophils Relative: 0 %
HCT: 16.8 % — ABNORMAL LOW (ref 39.0–52.0)
Hemoglobin: 6.3 g/dL — ABNORMAL LOW (ref 13.0–17.0)
Immature Granulocytes: 1 %
Lymphocytes Relative: 83 %
Lymphs Abs: 1.5 K/uL (ref 0.7–4.0)
MCH: 29.2 pg (ref 26.0–34.0)
MCHC: 37.5 g/dL — ABNORMAL HIGH (ref 30.0–36.0)
MCV: 77.8 fL — ABNORMAL LOW (ref 80.0–100.0)
Monocytes Absolute: 0.2 K/uL (ref 0.1–1.0)
Monocytes Relative: 13 %
Neutro Abs: 0.1 K/uL — CL (ref 1.7–7.7)
Neutrophils Relative %: 3 %
Platelets: 78 K/uL — ABNORMAL LOW (ref 150–400)
RBC: 2.16 MIL/uL — ABNORMAL LOW (ref 4.22–5.81)
RDW: 14.2 % (ref 11.5–15.5)
Smear Review: NORMAL
WBC: 1.8 K/uL — ABNORMAL LOW (ref 4.0–10.5)
nRBC: 0 % (ref 0.0–0.2)

## 2024-04-18 LAB — HEPATIC FUNCTION PANEL
ALT: 150 U/L — ABNORMAL HIGH (ref 0–44)
AST: 96 U/L — ABNORMAL HIGH (ref 15–41)
Albumin: 3.8 g/dL (ref 3.5–5.0)
Alkaline Phosphatase: 130 U/L — ABNORMAL HIGH (ref 38–126)
Bilirubin, Direct: 0.3 mg/dL — ABNORMAL HIGH (ref 0.0–0.2)
Indirect Bilirubin: 0.4 mg/dL (ref 0.3–0.9)
Total Bilirubin: 0.7 mg/dL (ref 0.0–1.2)
Total Protein: 6.3 g/dL — ABNORMAL LOW (ref 6.5–8.1)

## 2024-04-18 LAB — FERRITIN: Ferritin: >7500 ng/mL — ABNORMAL HIGH (ref 24–336)

## 2024-04-18 LAB — RESP PANEL BY RT-PCR (RSV, FLU A&B, COVID)  RVPGX2
Influenza A by PCR: NEGATIVE
Influenza B by PCR: NEGATIVE
Resp Syncytial Virus by PCR: NEGATIVE
SARS Coronavirus 2 by RT PCR: NEGATIVE

## 2024-04-18 LAB — TROPONIN T, HIGH SENSITIVITY: Troponin T High Sensitivity: <15 ng/L (ref 0–19)

## 2024-04-18 LAB — BASIC METABOLIC PANEL WITH GFR
Anion gap: 12 (ref 5–15)
BUN: 23 mg/dL — ABNORMAL HIGH (ref 6–20)
CO2: 25 mmol/L (ref 22–32)
Calcium: 9.4 mg/dL (ref 8.9–10.3)
Chloride: 99 mmol/L (ref 98–111)
Creatinine, Ser: 1.13 mg/dL (ref 0.61–1.24)
GFR, Estimated: >60 mL/min (ref >60)
Glucose, Bld: 150 mg/dL — ABNORMAL HIGH (ref 70–99)
Potassium: 4.2 mmol/L (ref 3.5–5.1)
Sodium: 136 mmol/L (ref 135–145)

## 2024-04-18 LAB — RETICULOCYTES
Immature Retic Fract: 2.4 % (ref 2.3–15.9)
RBC.: 2.07 MIL/uL — ABNORMAL LOW (ref 4.22–5.81)
Retic Count, Absolute: 6 K/uL — ABNORMAL LOW (ref 19.0–186.0)
Retic Ct Pct: <0.4 % — ABNORMAL LOW (ref 0.4–3.1)

## 2024-04-18 LAB — PREPARE RBC (CROSSMATCH)

## 2024-04-18 LAB — FIBRINOGEN: Fibrinogen: 331 mg/dL (ref 210–475)

## 2024-04-18 LAB — CBC
HCT: 16.5 % — ABNORMAL LOW (ref 39.0–52.0)
Hemoglobin: 6.1 g/dL — ABNORMAL LOW (ref 13.0–17.0)
MCH: 30.3 pg (ref 26.0–34.0)
MCHC: 37 g/dL — ABNORMAL HIGH (ref 30.0–36.0)
MCV: 82.1 fL (ref 80.0–100.0)
Platelets: 93 K/uL — ABNORMAL LOW (ref 150–400)
RBC: 2.01 MIL/uL — ABNORMAL LOW (ref 4.22–5.81)
RDW: 13.2 % (ref 11.5–15.5)
WBC: 1.1 K/uL — CL (ref 4.0–10.5)
nRBC: 0 % (ref 0.0–0.2)

## 2024-04-18 LAB — IRON AND TIBC
Iron: 233 ug/dL — ABNORMAL HIGH (ref 45–182)
Saturation Ratios: 86 % — ABNORMAL HIGH (ref 17.9–39.5)
TIBC: 272 ug/dL (ref 250–450)
UIBC: 39 ug/dL

## 2024-04-18 LAB — PROTIME-INR
INR: 1.8 — ABNORMAL HIGH (ref 0.8–1.2)
Prothrombin Time: 21.8 s — ABNORMAL HIGH (ref 11.4–15.2)

## 2024-04-18 LAB — HIV ANTIBODY (ROUTINE TESTING W REFLEX): HIV Screen 4th Generation wRfx: NONREACTIVE

## 2024-04-18 LAB — ABO/RH: ABO/RH(D): A POS

## 2024-04-18 LAB — LACTATE DEHYDROGENASE: LDH: 666 U/L — ABNORMAL HIGH (ref 105–235)

## 2024-04-18 LAB — VITAMIN B12: Vitamin B-12: 341 pg/mL (ref 180–914)

## 2024-04-18 LAB — FOLATE: Folate: 14.7 ng/mL (ref >5.9)

## 2024-04-18 MED ORDER — SODIUM CHLORIDE 0.9% IV SOLUTION
Freq: Once | INTRAVENOUS | Status: DC
  Filled 2024-04-18: qty 250

## 2024-04-18 MED ORDER — ONDANSETRON HCL 4 MG/2ML IJ SOLN
4.0000 mg | Freq: Once | INTRAMUSCULAR | Status: AC
  Administered 2024-04-18: 12:00:00 4 mg via INTRAVENOUS
  Filled 2024-04-18: qty 2

## 2024-04-18 MED ORDER — VENLAFAXINE HCL 37.5 MG PO TABS
37.5000 mg | ORAL_TABLET | Freq: Three times a day (TID) | ORAL | Status: DC
  Filled 2024-04-18: qty 1

## 2024-04-18 MED ORDER — SODIUM CHLORIDE 0.9 % IV BOLUS
1000.0000 mL | Freq: Once | INTRAVENOUS | Status: AC
  Administered 2024-04-18: 12:00:00 1000 mL via INTRAVENOUS

## 2024-04-18 MED ORDER — PANTOPRAZOLE SODIUM 40 MG IV SOLR
40.0000 mg | INTRAVENOUS | Status: DC
  Administered 2024-04-18 – 2024-04-19 (×2): 40 mg via INTRAVENOUS
  Filled 2024-04-18 (×2): qty 10

## 2024-04-18 MED ORDER — ALBUTEROL SULFATE (2.5 MG/3ML) 0.083% IN NEBU: 2.5000 mg | INHALATION_SOLUTION | RESPIRATORY_TRACT | Status: DC | PRN

## 2024-04-18 MED ORDER — VILAZODONE HCL 20 MG PO TABS
40.0000 mg | ORAL_TABLET | Freq: Every morning | ORAL | Status: DC
  Administered 2024-04-20: 09:00:00 40 mg via ORAL
  Filled 2024-04-18 (×3): qty 2

## 2024-04-18 MED ORDER — GABAPENTIN 300 MG PO CAPS
600.0000 mg | ORAL_CAPSULE | Freq: Three times a day (TID) | ORAL | Status: DC
  Administered 2024-04-18 – 2024-04-20 (×6): 600 mg via ORAL
  Filled 2024-04-18 (×6): qty 2

## 2024-04-18 MED ORDER — ACETAMINOPHEN 325 MG PO TABS
650.0000 mg | ORAL_TABLET | Freq: Four times a day (QID) | ORAL | Status: DC | PRN
  Administered 2024-04-19: 17:00:00 650 mg via ORAL
  Filled 2024-04-18 (×2): qty 2

## 2024-04-18 MED ORDER — PANTOPRAZOLE SODIUM 40 MG IV SOLR
40.0000 mg | Freq: Once | INTRAVENOUS | Status: AC
  Administered 2024-04-18: 12:00:00 40 mg via INTRAVENOUS
  Filled 2024-04-18: qty 10

## 2024-04-18 MED ORDER — SODIUM CHLORIDE 0.9 % IV SOLN: 10.0000 mL/h | Freq: Once | INTRAVENOUS | Status: DC

## 2024-04-18 MED ORDER — ACETAMINOPHEN 650 MG RE SUPP: 650.0000 mg | Freq: Four times a day (QID) | RECTAL | Status: DC | PRN

## 2024-04-18 MED ORDER — VITAMIN D (ERGOCALCIFEROL) 1.25 MG (50000 UNIT) PO CAPS: 50000.0000 [IU] | ORAL_CAPSULE | ORAL | Status: DC

## 2024-04-18 MED ORDER — LURASIDONE HCL 80 MG PO TABS
120.0000 mg | ORAL_TABLET | Freq: Every day | ORAL | Status: DC
  Filled 2024-04-18: qty 1

## 2024-04-18 NOTE — H&P (Addendum)
 History and Physical    Patient: Ronald Montoya FMW:[id number] DOB: [birth year] DOA: 04/18/2024 DOS: the patient was seen and examined on 04/18/2024 PCP: [facility]  Patient coming from: Home  Chief Complaint:  Chief Complaint  Patient presents with   Chest Pain   Shortness of Breath   HPI: Robbi Scurlock is a 48 y.o. 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  Patient presents to the emergency room for the second time in 10 days with complaints of worsening chest pain and exertional dyspnea.  Patient was last seen in the on 04/09/2024 with similar symptoms.  Workup in the ED at that time revealed findings concerning for pancytopenia of unclear etiology.  At that time, he did not require PRBC transfusion and was discharged to follow-up with hematology clinic.  He was yet to make appointment but decided to cancel emergency room due to worsening symptoms.  He denies any fever or chills.  Denied any recent viral infections.  Denies any nausea or vomiting.  Denies any melena like stool.  He admits compliance with his anticoagulation.  No prior history of bleeding dyscrasias.  He also denies any chronic alcohol use or tobacco smoking.  Family history significant for cancer in late mother.  In the ED this morning, workup including repeat CBC revealed WBC of 1.1, hemoglobin of 6.1.  Patient was referred to hospitalist service for further evaluation and management. Review of Systems: As mentioned in the history of present illness. All other systems reviewed and are negative. Past Medical History:  Diagnosis Date   Anxiety    Depression    Pulmonary embolism (HCC)    History reviewed. No pertinent surgical history. Social History:  reports that he has never smoked. He does not have any smokeless tobacco history on file. He reports that he does not drink alcohol and does not  use drugs.  Allergies[1]  History reviewed. No pertinent family history.  Prior to Admission medications  Medication Sig Start Date End Date Taking? Authorizing Provider  acetaminophen  (TYLENOL ) 500 MG tablet Take 500 mg by mouth every 8 (eight) hours as needed for mild pain (pain score 1-3) or moderate pain (pain score 4-6).   Yes [provider]  Cholecalciferol 100 MCG (4000 UT) CAPS Take 4,000 Units by mouth daily.   Yes [provider]  gabapentin  (NEURONTIN ) 600 MG tablet Take 600 mg by mouth 3 (three) times daily. 02/11/24  Yes [provider]  LORazepam (ATIVAN) 1 MG tablet Take 1 mg by mouth in the morning, at noon, and at bedtime. 04/04/24  Yes [provider]  Multiple Vitamins-Minerals (MULTIVITAMIN ADULT PO) Take by mouth.   Yes [provider]  Omega-3 1000 MG CAPS Take 1 g by mouth daily.   Yes [provider]  rivaroxaban (XARELTO) 20 MG TABS tablet Take 20 mg by mouth daily with supper.   Yes [provider]  VITAMIN D , ERGOCALCIFEROL , PO Take by mouth.   Yes [provider]  gabapentin  (NEURONTIN ) 300 MG capsule Take 300 mg by mouth 3 (three) times daily. Patient not taking: Reported on 04/18/2024    [provider]  Lurasidone  HCl 120 MG TABS Take 1 tablet by mouth daily.    [provider]  venlafaxine  (EFFEXOR ) 37.5 MG tablet Take 37.5 mg by mouth 3 (three) times daily with meals.    [provider]  Vilazodone  HCl (VIIBRYD ) 40 MG  TABS Take 40 mg by mouth every morning.    [provider]  warfarin (COUMADIN ) 10 MG tablet Take 1 tablet (10 mg total) by mouth daily. Takes 10 mg 4 days/week Takes 7.5 mg 3 days/week Patient not taking: Reported on 04/18/2024 08/03/15   Helon Clotilda LABOR, PA-C    Physical Exam: Vitals:   04/18/24 1130 04/18/24 1200 04/18/24 1230 04/18/24 1300  BP: 104/69 (!) 111/58 104/81 118/76  Pulse: 96 99 96 95  Resp: 18 (!) 21 20 15   Temp:       SpO2: 96% 95% 95% 100%  Weight:      Height:       General: Patient is an obese gentleman who was seen sitting comfortably in bed.  Family at bedside HEENT: Head is atraumatic, mucosa moist Neck: Supple no JVD Chest: Clinically diminished bilaterally due to body habitus Cardiovascular: Regular S1-S2 with no murmur Extremities with trace edema Skin negative for any new rash.  Mild bruising on right upper extremity  Data Reviewed: WBCs 1.1, hemoglobin is 6.1 hematocrit is 17, platelet count of 93, reticulocyte count is less than 0.4, absolute reticulocyte count is 6 PT is 21.8, INR is 1.8, iron is 233, TIBC is 272, ferritin 7500 Sodium is 136, potassium 4.2, chloride is 99, bicarb 25, glucose 150, BUN is 23, creatinine is 1.13  CT chest on 04/09/2024 showed no evidence of pulm emboli.  Scattered pulmonary nodules were noted in chest.  No significant mention of thymus.  Thyroid  gland and trachea showed no significant findings.   Assessment and Plan: 48 year old gentleman with history of pulm emboli on chronic anticoagulation with Xarelto.  Other comorbidities include depression.  He presents on account of symptomatic chest pain and exertional dyspnea.  Found to have profound anemia in the context of pancytopenia.  #1.  Acute profound anemia: Etiology is unclear.  Hemoglobin of 6.1, down from 8.3.  Will send stool for occult.  Patient is symptomatic for anemia and hence will require PRBC transfusion.  Patient is on chronic anticoagulation with Xarelto.  No prior history of GI blood loss.  PPI will be instituted.  Hematology has been consulted for further evaluation.  Consider GI evaluation if Hemoccult comes back positive.  #2.  Acute pancytopenia: Worsening pancytopenia over the past couple weeks.  Denies any viral illness.  Pancytopenia may be multifactorial.  Hematology will be consulted to help with further workup.  Reticulocyte count markedly elevated.  Ferritin elevated.  Addendum: Blood  smear reviewed with hematology suggest 10% blast cells, raising concerns for Acute leukemia.  Dr.Finnergan will assume care with plans for bone marrow biopsy.  #3.  Chronic back pain: Pain controlled with gabapentin .  Will continue with same regimen.  #4.  Depression: Patient is on the vilazodone  and lurasidone .  Clinically looks stable.  #5.  Severe neutropenia: ANC was less than 500 on 12/06.  Will advise on neutropenic precautions.  No indication for empiric antibiotics at this time.    Advance Care Planning:   Code Status: Full Code   Consults: Hematologist Dr. Babara.  Family Communication:   Severity of Illness: The appropriate patient status for this patient is INPATIENT. Inpatient status is judged to be reasonable and necessary in order to provide the required intensity of service to ensure the patient's safety. The patient's presenting symptoms, physical exam findings, and initial radiographic and laboratory data in the context of their chronic comorbidities is felt to place them at high risk for further clinical deterioration. Furthermore, it  is not anticipated that the patient will be medically stable for discharge from the hospital within 2 midnights of admission.   * I certify that at the point of admission it is my clinical judgment that the patient will require inpatient hospital care spanning beyond 2 midnights from the point of admission due to high intensity of service, high risk for further deterioration and high frequency of surveillance required.*  Author: Maude MARLA Dart, MD 04/18/2024 2:08 PM  For on call review [email or web address].      [1] No Known Allergies

## 2024-04-18 NOTE — Progress Notes (Deleted)
 Cardiology Office Note  Date:  04/18/2024   ID:  Ronald Montoya, DOB Feb 13, 1976, MRN [id number]  PCP:  [facility]   No chief complaint on file.   HPI:  Ronald Montoya is a 48 y.o. male with past medical history of: Past Medical History:  Diagnosis Date   Anxiety    Depression    Pulmonary embolism (HCC)   Who presents by referral from Dr. Carlin Palin for palpitations, shortness of breath  Seen in the emergency room April 09, 2024 Palpitations feeling of pounding in the chest along with some dizziness and shortness of breath. He denies associated pain in his chest and has not had any fever or cough. Symptoms have lasted for a few hours from the time that he woke up each for the past 2 days, after which they resolved. Since arriving to the ED, his symptoms have completely resolved and he states he feels back to normal.   Hemoglobin 8.9 Elevated D-dimer CT chest no PE   PMH:   has a past medical history of Anxiety, Depression, and Pulmonary embolism (HCC).   PSH:   No past surgical history on file.  No current facility-administered medications for this visit.   Current Outpatient Medications  Medication Sig Dispense Refill   gabapentin  (NEURONTIN ) 300 MG capsule Take 300 mg by mouth 3 (three) times daily.     Multiple Vitamins-Minerals (MULTIVITAMIN ADULT PO) Take by mouth.     rivaroxaban (XARELTO) 20 MG TABS tablet Take 20 mg by mouth daily with supper.     venlafaxine  (EFFEXOR ) 37.5 MG tablet Take 37.5 mg by mouth 3 (three) times daily with meals.     VITAMIN D , ERGOCALCIFEROL , PO Take by mouth.     warfarin (COUMADIN ) 10 MG tablet Take 1 tablet (10 mg total) by mouth daily. Takes 10 mg 4 days/week Takes 7.5 mg 3 days/week 30 tablet 4   Facility-Administered Medications Ordered in Other Visits  Medication Dose Route Frequency Provider Last Rate Last Admin   0.9 %  sodium chloride  infusion  10 mL/hr Intravenous Once Viviann Pastor, MD          Allergies:   Patient has no known allergies.   Social History:  The patient  reports that he has never smoked. He does not have any smokeless tobacco history on file. He reports that he does not drink alcohol and does not use drugs.   Family History:   family history is not on file.    Review of Systems: ROS   PHYSICAL EXAM: VS:  There were no vitals taken for this visit. , BMI There is no height or weight on file to calculate BMI. GEN: Well nourished, well developed, in no acute distress HEENT: normal Neck: no JVD, carotid bruits, or masses Cardiac: RRR; no murmurs, rubs, or gallops,no edema  Respiratory:  clear to auscultation bilaterally, normal work of breathing GI: soft, nontender, nondistended, + BS MS: no deformity or atrophy Skin: warm and dry, no rash Neuro:  Strength and sensation are intact Psych: euthymic mood, full affect    Recent Labs: 04/09/2024: TSH 4.800 04/18/2024: BUN 23; Creatinine, Ser 1.13; Hemoglobin 6.1; Platelets 93; Potassium 4.2; Sodium 136    Lipid Panel Lab Results  Component Value Date   CHOL 217 (A) 10/06/2014   HDL 32 (A) 10/06/2014   LDLCALC 125 10/06/2014   TRIG 299 (A) 10/06/2014      Wt Readings from Last 3 Encounters:  04/18/24 280 lb (127 kg)  04/09/24  280 lb (127 kg)  06/18/15 255 lb (115.7 kg)       ASSESSMENT AND PLAN:  Problem List Items Addressed This Visit   None    Disposition:   F/U  12 months   Total encounter time more than 30 minutes  Greater than 50% was spent in counseling and coordination of care with the patient    Signed, Velinda Lunger, M.D., Ph.D. [facility] [place], [place] [phone]

## 2024-04-18 NOTE — ED Notes (Signed)
Blood consent form signed at this time.

## 2024-04-18 NOTE — ED Triage Notes (Signed)
 Pt comes with cp, sob and numbness in hands that started week ago. Pt states it is getting worse now.   Pt states left sided cp and radiates to his neck. Pt states nausea and denies any vomiting.

## 2024-04-18 NOTE — ED Notes (Signed)
 No repeat trop per MD.

## 2024-04-18 NOTE — ED Provider Notes (Signed)
 [facility] Provider Note    Event Date/Time   First MD Initiated Contact with Patient 04/18/24 1114     (approximate)   History   Chief Complaint: Chest Pain and Shortness of Breath   HPI  Ronald Montoya is a 48 y.o. male with a history of depression, anxiety, pulmonary embolism who comes ED complaining of chest tightness, shortness of breath ongoing for the past 2 weeks, progressively worsening.  Gets very [place] of breath with standing and walking even [place] distances inside of his home.  No cough or fever.  Pain is not pleuritic.  He was previously seen in the ED 9 days ago for similar symptoms which were milder at the time but included dizziness with waking up in the morning, found to be pancytopenic, planned for follow-up with hematology.  His appointment was today but he came to the ED instead because his symptoms had become much worse.  Does report compliance with Xarelto for PE history  CT obtained on April 09, 2024 was negative for acute PE      Past Medical History:  Diagnosis Date   Anxiety    Depression    Pulmonary embolism [facility])     Current Outpatient Rx   Order #: [id number] Class: Historical Med   Order #: [id number] Class: Historical Med   Order #: [id number] Class: Historical Med   Order #: [id number] Class: Historical Med   Order #: [id number] Class: Historical Med   Order #: [id number] Class: Normal    History reviewed. No pertinent surgical history.  Physical Exam   Triage Vital Signs: ED Triage Vitals  Encounter Vitals Group     BP 04/18/24 1101 128/74     Girls Systolic BP Percentile --      Girls Diastolic BP Percentile --      Boys Systolic BP Percentile --      Boys Diastolic BP Percentile --      Pulse Rate 04/18/24 1101 (!) 103     Resp 04/18/24 1101 20     Temp 04/18/24 1101 97.6 F (36.4 C)     Temp src --      SpO2 04/18/24 1101 96 %     Weight 04/18/24 1058 280 lb (127 kg)     Height 04/18/24 1058 6' 2 (1.88 m)      Head Circumference --      Peak Flow --      Pain Score 04/18/24 1058 4     Pain Loc --      Pain Education --      Exclude from Growth Chart --     Most recent vital signs: Vitals:   04/18/24 1200 04/18/24 1230  BP: (!) 111/58 104/81  Pulse: 99 96  Resp: (!) 21 20  Temp:    SpO2: 95% 95%    General: Awake, no distress.  CV:  Good peripheral perfusion.  Regular rate rhythm Resp:  Normal effort.  Clear lungs Abd:  No distention.  Soft nontender Other:  No lower extremity edema.   ED Results / Procedures / Treatments   Labs (all labs ordered are listed, but only abnormal results are displayed) Labs Reviewed  BASIC METABOLIC PANEL WITH GFR - Abnormal; Notable for the following components:      Result Value   Glucose, Bld 150 (*)    BUN 23 (*)    All other components within normal limits  CBC - Abnormal; Notable for the following components:   WBC 1.1 (*)  RBC 2.01 (*)    Hemoglobin 6.1 (*)    HCT 16.5 (*)    MCHC 37.0 (*)    Platelets 93 (*)    All other components within normal limits  RETICULOCYTES - Abnormal; Notable for the following components:   Retic Ct Pct <0.4 (*)    RBC. 2.07 (*)    Retic Count, Absolute 6.0 (*)    All other components within normal limits  PROTIME-INR - Abnormal; Notable for the following components:   Prothrombin Time 21.8 (*)    INR 1.8 (*)    All other components within normal limits  RESP PANEL BY RT-PCR (RSV, FLU A&B, COVID)  RVPGX2  VITAMIN B12  FOLATE  IRON AND TIBC  FERRITIN  HIV ANTIBODY (ROUTINE TESTING W REFLEX)  CBC WITH DIFFERENTIAL/PLATELET  TYPE AND SCREEN  PREPARE RBC (CROSSMATCH)  ABO/RH  TROPONIN T, HIGH SENSITIVITY  TROPONIN T, HIGH SENSITIVITY     EKG Interpreted by me Sinus tachycardia rate 107.  Normal axis and intervals.  Normal QRS ST segments and T waves.   RADIOLOGY Chest x-ray interpreted by me, unremarkable.  Radiology report reviewed   PROCEDURES:  .Critical Care  Performed by:  Viviann Pastor, MD Authorized by: Viviann Pastor, MD   Critical care provider statement:    Critical care time (minutes):  35   Critical care time was exclusive of:  Separately billable procedures and treating other patients   Critical care was necessary to treat or prevent imminent or life-threatening deterioration of the following conditions:  Circulatory failure and shock   Critical care was time spent personally by me on the following activities:  Development of treatment plan with patient or surrogate, discussions with consultants, evaluation of patient's response to treatment, examination of patient, obtaining history from patient or surrogate, ordering and performing treatments and interventions, ordering and review of laboratory studies, ordering and review of radiographic studies, pulse oximetry, re-evaluation of patient's condition and review of old charts   Care discussed with: admitting provider      MEDICATIONS ORDERED IN ED: Medications  0.9 %  sodium chloride  infusion (has no administration in time range)  ondansetron  (ZOFRAN ) injection 4 mg (4 mg Intravenous Given 04/18/24 1200)  sodium chloride  0.9 % bolus 1,000 mL (1,000 mLs Intravenous New Bag/Given 04/18/24 1159)  pantoprazole  (PROTONIX ) injection 40 mg (40 mg Intravenous Given 04/18/24 1200)     IMPRESSION / MDM / ASSESSMENT AND PLAN / ED COURSE  I reviewed the triage vital signs and the nursing notes.  DDx: AKI, electrolyte derangement, anemia, NSTEMI, pneumonia, pleural effusion, pneumothorax, viral illness  Patient's presentation is most consistent with acute presentation with potential threat to life or bodily function.  Patient presents with gradually worsening symptoms of chest pain and shortness of breath over the past 2 weeks.  Will repeat labs today, give IV fluids and Zofran  and Protonix .   Clinical Course as of 04/18/24 1320  Mon Apr 18, 2024  1220 CBC reveals progressive pancytopenia with  hemoglobin of 6.1, white blood cell count of 1.1.  Will need to transfuse RBCs, check anemia labs, HIV, plan to admit [PS]    Clinical Course User Index [PS] Viviann Pastor, MD     ----------------------------------------- 1:24 PM on 04/18/2024 ----------------------------------------- Case discussed with hospitalist.  FINAL CLINICAL IMPRESSION(S) / ED DIAGNOSES   Final diagnoses:  Symptomatic anemia  Pancytopenia (HCC)     Rx / DC Orders   ED Discharge Orders     None  Note:  This document was prepared using Dragon voice recognition software and may include unintentional dictation errors.   Viviann Pastor, MD 04/18/24 1325

## 2024-04-18 NOTE — ED Notes (Signed)
Pt gone to xray at this time

## 2024-04-19 ENCOUNTER — Ambulatory Visit: Payer: MEDICAID | Attending: Cardiovascular Disease | Admitting: Cardiovascular Disease

## 2024-04-19 DIAGNOSIS — R079 Chest pain, unspecified: Secondary | ICD-10-CM

## 2024-04-19 DIAGNOSIS — I2782 Chronic pulmonary embolism: Secondary | ICD-10-CM

## 2024-04-19 DIAGNOSIS — R002 Palpitations: Secondary | ICD-10-CM

## 2024-04-19 LAB — BASIC METABOLIC PANEL WITH GFR
Anion gap: 9 (ref 5–15)
BUN: 19 mg/dL (ref 6–20)
CO2: 26 mmol/L (ref 22–32)
Calcium: 8.1 mg/dL — ABNORMAL LOW (ref 8.9–10.3)
Chloride: 98 mmol/L (ref 98–111)
Creatinine, Ser: 0.95 mg/dL (ref 0.61–1.24)
GFR, Estimated: >60 mL/min (ref >60)
Glucose, Bld: 108 mg/dL — ABNORMAL HIGH (ref 70–99)
Potassium: 4.2 mmol/L (ref 3.5–5.1)
Sodium: 133 mmol/L — ABNORMAL LOW (ref 135–145)

## 2024-04-19 LAB — CBC
HCT: 21.1 % — ABNORMAL LOW (ref 39.0–52.0)
Hemoglobin: 7.8 g/dL — ABNORMAL LOW (ref 13.0–17.0)
MCH: 29.3 pg (ref 26.0–34.0)
MCHC: 37 g/dL — ABNORMAL HIGH (ref 30.0–36.0)
MCV: 79.3 fL — ABNORMAL LOW (ref 80.0–100.0)
Platelets: 74 K/uL — ABNORMAL LOW (ref 150–400)
RBC: 2.66 MIL/uL — ABNORMAL LOW (ref 4.22–5.81)
RDW: 14.6 % (ref 11.5–15.5)
WBC: 1.3 K/uL — CL (ref 4.0–10.5)
nRBC: 0 % (ref 0.0–0.2)

## 2024-04-19 MED ORDER — LURASIDONE HCL 40 MG PO TABS
120.0000 mg | ORAL_TABLET | Freq: Every day | ORAL | Status: DC
  Administered 2024-04-20: 08:00:00 120 mg via ORAL
  Filled 2024-04-19 (×2): qty 3

## 2024-04-19 MED ORDER — VENLAFAXINE HCL 37.5 MG PO TABS
37.5000 mg | ORAL_TABLET | Freq: Three times a day (TID) | ORAL | Status: DC
  Administered 2024-04-20: 08:00:00 37.5 mg via ORAL
  Filled 2024-04-19 (×4): qty 1

## 2024-04-19 NOTE — Consult Note (Signed)
 [facility]  [phone] [phone]  ID: Ronald Montoya OB: [birth year]  MR#: [id number]  RDW#:[id number]  Patient Care Team: Physicians, [facility] Faculty as PCP - General  CHIEF COMPLAINT: Pancytopenia, possible acute leukemia.  INTERVAL HISTORY: Patient is a 48 year old male who initially presented to the emergency room over a week ago as found to have a significant pancytopenia.  Blasts were also seen on his peripheral smear.  He did not keep follow-up appointment at the cancer center for further evaluation and represented to the emergency room yesterday with chest pain and shortness of breath.  Pancytopenia was noted to be worse.  He currently feels well.  He no longer is complaining of chest pain or shortness of breath.  He has no neurologic complaints.  He denies any fevers.  He has a good appetite and denies weight loss.  He has no hemoptysis or cough.  He denies any nausea, vomiting, constipation, or diarrhea.  He has no urinary complaints.  Patient offers no specific complaints today.  REVIEW OF SYSTEMS:   Review of Systems  Constitutional: Negative.  Negative for chills, fever and malaise/fatigue.  Respiratory: Negative.  Negative for cough, hemoptysis and shortness of breath.   Cardiovascular: Negative.  Negative for chest pain and leg swelling.  Gastrointestinal: Negative.  Negative for abdominal pain.  Genitourinary: Negative.  Negative for dysuria.  Musculoskeletal: Negative.  Negative for back pain.  Skin: Negative.  Negative for rash.  Neurological: Negative.  Negative for dizziness, focal weakness, weakness and headaches.  Psychiatric/Behavioral: Negative.  The patient is not nervous/anxious.     As per HPI. Otherwise, a complete review of systems is negative.  PAST MEDICAL HISTORY: Past Medical History:  Diagnosis Date   Anxiety    Depression    Pulmonary embolism (HCC)     PAST SURGICAL HISTORY: History reviewed. No pertinent  surgical history.  FAMILY HISTORY: History reviewed. No pertinent family history.  ADVANCED DIRECTIVES (Y/N):  @ADVDIR @  HEALTH MAINTENANCE: Social History[1]   Colonoscopy:  PAP:  Bone density:  Lipid panel:  Allergies[2]  Current Facility-Administered Medications  Medication Dose Route Frequency Provider Last Rate Last Admin   acetaminophen  (TYLENOL ) tablet 650 mg  650 mg Oral Q6H PRN Acheampong, Peter K, MD       Or   acetaminophen  (TYLENOL ) suppository 650 mg  650 mg Rectal Q6H PRN Acheampong, Peter K, MD       albuterol  (PROVENTIL ) (2.5 MG/3ML) 0.083% nebulizer solution 2.5 mg  2.5 mg Nebulization Q2H PRN Acheampong, Peter K, MD       gabapentin  (NEURONTIN ) capsule 600 mg  600 mg Oral TID Acheampong, Peter K, MD   600 mg at 04/19/24 9085   lurasidone  (LATUDA ) tablet 120 mg  120 mg Oral Q breakfast Acheampong, Peter K, MD       pantoprazole  (PROTONIX ) injection 40 mg  40 mg Intravenous Q24H Acheampong, Peter K, MD   40 mg at 04/18/24 1623   Vilazodone  HCl TABS 40 mg  40 mg Oral q morning Acheampong, Peter K, MD       [START ON 04/24/2024] Vitamin D  (Ergocalciferol ) (DRISDOL ) 1.25 MG (50000 UNIT) capsule 50,000 Units  50,000 Units Oral Q7 days Marca Maude POUR, MD        OBJECTIVE: Vitals:   04/19/24 0823 04/19/24 1210  BP: 116/69 119/77  Pulse: 95 91  Resp:  16  Temp: 99.7 F (37.6 C) 98.9 F (37.2 C)  SpO2: 92% 100%     Body mass  index is 35.95 kg/m.    ECOG FS:0 - Asymptomatic  General: Well-developed, well-nourished, no acute distress. Eyes: Pink conjunctiva, anicteric sclera. HEENT: Normocephalic, moist mucous membranes. Lungs: No audible wheezing or coughing. Heart: Regular rate and rhythm. Abdomen: Soft, nontender, no obvious distention. Musculoskeletal: No edema, cyanosis, or clubbing. Neuro: Alert, answering all questions appropriately. Cranial nerves grossly intact. Skin: No rashes or petechiae noted. Psych: Normal affect. Lymphatics: No cervical,  calvicular, axillary or inguinal LAD.   LAB RESULTS:  Lab Results  Component Value Date   NA 133 (L) 04/19/2024   K 4.2 04/19/2024   CL 98 04/19/2024   CO2 26 04/19/2024   GLUCOSE 108 (H) 04/19/2024   BUN 19 04/19/2024   CREATININE 0.95 04/19/2024   CALCIUM 8.1 (L) 04/19/2024   PROT 6.3 (L) 04/18/2024   ALBUMIN 3.8 04/18/2024   AST 96 (H) 04/18/2024   ALT 150 (H) 04/18/2024   ALKPHOS 130 (H) 04/18/2024   BILITOT 0.7 04/18/2024   GFRNONAA >60 04/19/2024    Lab Results  Component Value Date   WBC 1.3 (LL) 04/19/2024   NEUTROABS 0.1 (LL) 04/18/2024   HGB 7.8 (L) 04/19/2024   HCT 21.1 (L) 04/19/2024   MCV 79.3 (L) 04/19/2024   PLT 74 (L) 04/19/2024     STUDIES: DG Chest 2 View Result Date: 04/18/2024 EXAM: 2 VIEW(S) XRAY OF THE CHEST 04/18/2024 11:18:28 AM COMPARISON: 04/09/2024 CLINICAL HISTORY: 48 year old male with chest pain. FINDINGS: LUNGS AND PLEURA: Stable linear scarring at left base. Lower lung volumes. No pleural effusion. No pneumothorax. HEART AND MEDIASTINUM: No acute abnormality of the cardiac and mediastinal silhouettes. BONES AND SOFT TISSUES: No acute osseous abnormality. IMPRESSION: 1. No acute cardiopulmonary abnormality. Electronically signed by: Helayne Hurst MD 04/18/2024 11:39 AM EST RP Workstation: [id number]   CT Angio Chest PE W/Cm &/Or Wo Cm Result Date: 04/09/2024 CLINICAL DATA:  Pulmonary embolism suspected, low to intermediate probability, positive D-dimer. EXAM: CT ANGIOGRAPHY CHEST WITH CONTRAST TECHNIQUE: Multidetector CT imaging of the chest was performed using the standard protocol during bolus administration of intravenous contrast. Multiplanar CT image reconstructions and MIPs were obtained to evaluate the vascular anatomy. RADIATION DOSE REDUCTION: This exam was performed according to the departmental dose-optimization program which includes automated exposure control, adjustment of the mA and/or kV according to patient size and/or use of  iterative reconstruction technique. CONTRAST:  75mL OMNIPAQUE  IOHEXOL  350 MG/ML SOLN COMPARISON:  None Available. FINDINGS: Cardiovascular: Heart is normal in size and there is no pericardial effusion. The aorta and pulmonary trunk are normal in caliber. No evidence of pulmonary embolism is seen. Mediastinum/Nodes: No enlarged mediastinal, hilar, or axillary lymph nodes. Thyroid  gland, trachea, and esophagus demonstrate no significant findings. Lungs/Pleura: Paraseptal and centrilobular emphysematous changes are noted in the lungs. Strandy airspace disease is noted in the lingular segment of the left upper lobe. Atelectasis is present bilaterally. No effusion or pneumothorax is seen. A few scattered pulmonary nodules are noted on the right measuring up to 3 mm in the right upper lobe, axial image 29. Upper Abdomen: There is fatty infiltration of the liver. Visualized portion of the spleen appears slightly enlarged. No acute abnormality is seen. Musculoskeletal: Degenerative changes are present in the thoracic spine. No acute osseous abnormality. Review of the MIP images confirms the above findings. IMPRESSION: 1. No evidence of pulmonary embolism. 2. Strandy opacity in the left upper lobe, possible atelectasis or infiltrate. 3. Scattered pulmonary nodules in the right lung measuring up to 3 mm. No follow-up needed if  patient is low-risk (and has no known or suspected primary neoplasm). Non-contrast chest CT can be considered in 12 months if patient is high-risk. This recommendation follows the consensus statement: Guidelines for Management of Incidental Pulmonary Nodules Detected on CT Images: From the Fleischner Society 2017; Radiology 2017; 284:228-243. 4. Hepatic steatosis. Electronically Signed   By: Leita Birmingham M.D.   On: 04/09/2024 14:57   DG Chest 2 View Result Date: 04/09/2024 CLINICAL DATA:  Chest pain, Bowell of breath, dizziness EXAM: DG CHEST 2V COMPARISON:  None Available. FINDINGS: The heart size  and mediastinal contours are within normal limits. Both lungs are clear. The visualized skeletal structures are unremarkable. IMPRESSION: No active cardiopulmonary disease. Electronically Signed   By: Ozell Daring M.D.   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  PLAN:    Possible acute leukemia: Pathologist peripheral blood smear review on April 09, 2024 revealed approximately 10% blasts.  Patient missed several appointments in the interim to the cancer center for further evaluation.  Bone marrow biopsy is currently scheduled for Thursday, but hope to have that sooner if possible.  Case also discussed with [facility] who typically will follow-up this patient population as an outpatient, but given patient's poor social support and recent no-shows, it is agreed upon that a possible inpatient to inpatient transfer might be best. Neutropenia: Patient's most recent ANC is 0.1.  Bone marrow biopsy as above. Anemia: Patient's hemoglobin was 6.1 upon admission has now improved to 7.8.  Continue to maintain hemoglobin greater than 7.0.  All blood products should be irradiated. Thrombocytopenia: Patient's platelet count is slowly trending down and most recent result was 74.  He does not require transfusion at this time.  If patient requires platelets in the future, these must be irradiated as well. History of depression/anxiety: Patient has frequent follow-up with psychiatry at [facility].  Appreciate consult, will follow.   Evalene JINNY Reusing, MD   04/19/2024 1:17 PM        [1]  Social History Tobacco Use   Smoking status: Never  Vaping Use   Vaping status: Never Used  Substance Use Topics   Alcohol use: No   Drug use: Never  [2] No Known Allergies

## 2024-04-19 NOTE — Plan of Care (Signed)
  Problem: Education: Goal: Knowledge of General Education information will improve Description: Including pain rating scale, medication(s)/side effects and non-pharmacologic comfort measures Outcome: Progressing   Problem: Clinical Measurements: Goal: Ability to maintain clinical measurements within normal limits will improve Outcome: Progressing   Problem: Clinical Measurements: Goal: Will remain free from infection Outcome: Progressing   Problem: Clinical Measurements: Goal: Respiratory complications will improve Outcome: Progressing   

## 2024-04-19 NOTE — Progress Notes (Signed)
 Report called to [facility]. Patient going to 4 Oncology room 4803. [facility] to call back with transport information. Patient updated on plan.

## 2024-04-19 NOTE — Discharge Summary (Signed)
 DISCHARGE Ronald Montoya FMW:[id number] DOB: Apr 23, 1976 DOA: 04/18/2024  PCP: [facility]  Admit date: 04/18/2024 Discharge date: 04/19/2024  Discharging directly to [facility] for cancer care Accepting physician: Dr. Laneta Claudean Salvage Course: Ronald Gatson is a 48 year old with a resistant depression and anxiety, history of PE on Xarelto, who presents with shortness of breath.  Patient initially presented to the ED on 12/6 with shortness of breath and had CTA PE at that time which revealed lung nodules but no active pulmonary embolism.  During that visit he was found to have pancytopenia and 10% blasts on peripheral smear.  He was scheduled for outpatient follow-up but was unable to make those appointments.  He reports has been feeling unwell and presented again 12/15.  On arrival this time WBC 1.1, hemoglobin 6.1.  He was admitted and received 1 unit PRBC. 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  We have discussed this case with hematology/oncology at [facility].  Typically this patient would be seen as an outpatient but his social support is poor and he has had recent no-shows when attempting to establish outpatient follow-up with our team.  For this reason [facility] has accepted inpatient to inpatient transfer for workup. We attempted to perform bone marrow biopsy during this admission but due to staffing delays this cannot be done until Thursday at the earliest.  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  No evidence of DIC at this time. PT 21.8, INR 1.8, Fibrinogen  331 - Platelet count is downtrending, 101 down to 73 now. - Hemoglobin downtrending, 8.3 on 12/6 down to 6.1 on arrival.  Status post 1 unit PRBC - 10% blasts seen on peripheral smear review 12/6 - LDH 666 - Bone marrow biopsied by IR team cannot perform until Thursday at the earliest - CTA chest 12/6 with pulmonary  nodules, no evidence of PE  Remote history of pulmonary embolism - Hematology at [facility] as confirmed okay to continue with Xarelto until platelets go below 50.  We are pending bone marrow biopsy Xarelto has been held for now  Depression Anxiety - Follows outpatient psychiatry at [facility].  Receives intermittent ECT.  Continue home meds   Discharge Instructions  Discharge Instructions     Call MD for:  difficulty breathing, headache or visual disturbances   Complete by: As directed    Call MD for:  persistant dizziness or light-headedness   Complete by: As directed    Call MD for:  persistant nausea and vomiting   Complete by: As directed    Call MD for:  severe uncontrolled pain   Complete by: As directed    Call MD for:  temperature >100.4   Complete by: As directed    Diet general   Complete by: As directed    Increase activity slowly   Complete by: As directed       Allergies as of 04/19/2024   No Known Allergies      Medication List     STOP taking these medications    warfarin 10 MG tablet Commonly known as: COUMADIN        TAKE these medications    acetaminophen  500 MG tablet Commonly known as: TYLENOL  Take 500 mg by mouth every 8 (eight) hours as needed for mild pain (pain score 1-3) or moderate pain (pain score 4-6).   Cholecalciferol 100 MCG (4000 UT) Caps Take 4,000 Units by mouth daily.  gabapentin  600 MG tablet Commonly known as: NEURONTIN  Take 600 mg by mouth 3 (three) times daily. What changed: Another medication with the same name was removed. Continue taking this medication, and follow the directions you see here.   LORazepam 1 MG tablet Commonly known as: ATIVAN Take 1 mg by mouth in the morning, at noon, and at bedtime.   Lurasidone  HCl 120 MG Tabs Take 1 tablet by mouth daily.   MULTIVITAMIN ADULT PO Take by mouth.   Omega-3 1000 MG Caps Take 1 g by mouth daily.   rivaroxaban 20 MG Tabs tablet Commonly known as: XARELTO Take 20 mg  by mouth daily with supper.   venlafaxine  37.5 MG tablet Commonly known as: EFFEXOR  Take 37.5 mg by mouth 3 (three) times daily with meals.   Vilazodone  HCl 40 MG Tabs Commonly known as: VIIBRYD  Take 40 mg by mouth every morning.   VITAMIN D  (ERGOCALCIFEROL ) PO Take by mouth.        Allergies[1]  Consultations: Treatment Team:  Jacobo Evalene PARAS, MD   Procedures/Studies: DG Chest 2 View Result Date: 04/18/2024 EXAM: 2 VIEW(S) XRAY OF THE CHEST 04/18/2024 11:18:28 AM COMPARISON: 04/09/2024 CLINICAL HISTORY: 48 year old male with chest pain. FINDINGS: LUNGS AND PLEURA: Stable linear scarring at left base. Lower lung volumes. No pleural effusion. No pneumothorax. HEART AND MEDIASTINUM: No acute abnormality of the cardiac and mediastinal silhouettes. BONES AND SOFT TISSUES: No acute osseous abnormality. IMPRESSION: 1. No acute cardiopulmonary abnormality. Electronically signed by: Helayne Hurst MD 04/18/2024 11:39 AM EST RP Workstation: [id number]   CT Angio Chest PE W/Cm &/Or Wo Cm Result Date: 04/09/2024 CLINICAL DATA:  Pulmonary embolism suspected, low to intermediate probability, positive D-dimer. EXAM: CT ANGIOGRAPHY CHEST WITH CONTRAST TECHNIQUE: Multidetector CT imaging of the chest was performed using the standard protocol during bolus administration of intravenous contrast. Multiplanar CT image reconstructions and MIPs were obtained to evaluate the vascular anatomy. RADIATION DOSE REDUCTION: This exam was performed according to the departmental dose-optimization program which includes automated exposure control, adjustment of the mA and/or kV according to patient size and/or use of iterative reconstruction technique. CONTRAST:  75mL OMNIPAQUE  IOHEXOL  350 MG/ML SOLN COMPARISON:  None Available. FINDINGS: Cardiovascular: Heart is normal in size and there is no pericardial effusion. The aorta and pulmonary trunk are normal in caliber. No evidence of pulmonary embolism is seen.  Mediastinum/Nodes: No enlarged mediastinal, hilar, or axillary lymph nodes. Thyroid  gland, trachea, and esophagus demonstrate no significant findings. Lungs/Pleura: Paraseptal and centrilobular emphysematous changes are noted in the lungs. Strandy airspace disease is noted in the lingular segment of the left upper lobe. Atelectasis is present bilaterally. No effusion or pneumothorax is seen. A few scattered pulmonary nodules are noted on the right measuring up to 3 mm in the right upper lobe, axial image 29. Upper Abdomen: There is fatty infiltration of the liver. Visualized portion of the spleen appears slightly enlarged. No acute abnormality is seen. Musculoskeletal: Degenerative changes are present in the thoracic spine. No acute osseous abnormality. Review of the MIP images confirms the above findings. IMPRESSION: 1. No evidence of pulmonary embolism. 2. Strandy opacity in the left upper lobe, possible atelectasis or infiltrate. 3. Scattered pulmonary nodules in the right lung measuring up to 3 mm. No follow-up needed if patient is low-risk (and has no known or suspected primary neoplasm). Non-contrast chest CT can be considered in 12 months if patient is high-risk. This recommendation follows the consensus statement: Guidelines for Management of Incidental Pulmonary Nodules Detected on CT  Images: From the Fleischner Society 2017; Radiology 2017; [phone]. 4. Hepatic steatosis. Electronically Signed   By: Leita Birmingham M.D.   On: 04/09/2024 14:57   DG Chest 2 View Result Date: 04/09/2024 CLINICAL DATA:  Chest pain, Granier of breath, dizziness EXAM: DG CHEST 2V COMPARISON:  None Available. FINDINGS: The heart size and mediastinal contours are within normal limits. Both lungs are clear. The visualized skeletal structures are unremarkable. IMPRESSION: No active cardiopulmonary disease. Electronically Signed   By: Ozell Daring M.D.   On: 04/09/2024 13:16      Discharge Exam: Vitals:   04/19/24 0823  04/19/24 1210  BP: 116/69 119/77  Pulse: 95 91  Resp:  16  Temp: 99.7 F (37.6 C) 98.9 F (37.2 C)  SpO2: 92% 100%   Vitals:   04/19/24 0119 04/19/24 0510 04/19/24 0823 04/19/24 1210  BP: 119/67 108/62 116/69 119/77  Pulse: 85 98 95 91  Resp: 15 20  16   Temp: 99.1 F (37.3 C) 99.5 F (37.5 C) 99.7 F (37.6 C) 98.9 F (37.2 C)  TempSrc: Oral  Oral   SpO2: 92% 90% 92% 100%  Weight:      Height:        Constitutional:  Normal appearance. Non toxic-appearing.  HENT: Head Normocephalic and atraumatic.  Mucous membranes are moist.  Eyes:  Extraocular intact. Conjunctivae normal.  Cardiovascular: Rate and Rhythm: Normal rate and regular rhythm.  Pulmonary: Non labored, symmetric rise of chest wall.  Skin: warm and dry. not jaundiced.  Neurological: No focal deficit present. alert. Oriented.  Psychiatric: Mood and Affect congruent.    The results of significant diagnostics from this hospitalization (including imaging, microbiology, ancillary and laboratory) are listed below for reference.     Microbiology: Recent Results (from the past 240 hours)  Blood Culture (routine x 2)     Status: None   Collection Time: 04/09/24  4:20 PM   Specimen: BLOOD  Result Value Ref Range Status   Specimen Description BLOOD BLOOD RIGHT FOREARM  Final   Special Requests   Final    BOTTLES DRAWN AEROBIC AND ANAEROBIC Blood Culture adequate volume   Culture   Final    NO GROWTH 5 DAYS Performed at [facility], [address] [place], [place] [address]    Report Status 04/14/2024 FINAL  Final  Blood Culture (routine x 2)     Status: None   Collection Time: 04/09/24  4:25 PM   Specimen: BLOOD  Result Value Ref Range Status   Specimen Description BLOOD BLOOD LEFT HAND  Final   Special Requests   Final    BOTTLES DRAWN AEROBIC AND ANAEROBIC Blood Culture adequate volume   Culture   Final    NO GROWTH 5 DAYS Performed at [facility], [address] [place],  [place] [address]    Report Status 04/14/2024 FINAL  Final  Resp panel by RT-PCR (RSV, Flu A&B, Covid) Anterior Nasal Swab     Status: None   Collection Time: 04/18/24 12:40 PM   Specimen: Anterior Nasal Swab  Result Value Ref Range Status   SARS Coronavirus 2 by RT PCR NEGATIVE NEGATIVE Final    Comment: (NOTE) SARS-CoV-2 target nucleic acids are NOT DETECTED.  The SARS-CoV-2 RNA is generally detectable in upper respiratory specimens during the acute phase of infection. The lowest concentration of SARS-CoV-2 viral copies this assay can detect is 138 copies/mL. A negative result does not preclude SARS-Cov-2 infection and should not be used as the  sole basis for treatment or other patient management decisions. A negative result may occur with  improper specimen collection/handling, submission of specimen other than nasopharyngeal swab, presence of viral mutation(s) within the areas targeted by this assay, and inadequate number of viral copies(<138 copies/mL). A negative result must be combined with clinical observations, patient history, and epidemiological information. The expected result is Negative.  Fact Sheet for Patients:  bloggercourse.com  Fact Sheet for Healthcare Providers:  seriousbroker.it  This test is no t yet approved or cleared by the [place]  FDA and  has been authorized for detection and/or diagnosis of SARS-CoV-2 by FDA under an Emergency Use Authorization (EUA). This EUA will remain  in effect (meaning this test can be used) for the duration of the COVID-19 declaration under Section 564(b)(1) of the Act, 21 U.S.C.section 360bbb-3(b)(1), unless the authorization is terminated  or revoked sooner.       Influenza A by PCR NEGATIVE NEGATIVE Final   Influenza B by PCR NEGATIVE NEGATIVE Final    Comment: (NOTE) The Xpert Xpress SARS-CoV-2/FLU/RSV plus assay is intended as an aid in the diagnosis of influenza from  Nasopharyngeal swab specimens and should not be used as a sole basis for treatment. Nasal washings and aspirates are unacceptable for Xpert Xpress SARS-CoV-2/FLU/RSV testing.  Fact Sheet for Patients: bloggercourse.com  Fact Sheet for Healthcare Providers: seriousbroker.it  This test is not yet approved or cleared by the [place]  FDA and has been authorized for detection and/or diagnosis of SARS-CoV-2 by FDA under an Emergency Use Authorization (EUA). This EUA will remain in effect (meaning this test can be used) for the duration of the COVID-19 declaration under Section 564(b)(1) of the Act, 21 U.S.C. section 360bbb-3(b)(1), unless the authorization is terminated or revoked.     Resp Syncytial Virus by PCR NEGATIVE NEGATIVE Final    Comment: (NOTE) Fact Sheet for Patients: bloggercourse.com  Fact Sheet for Healthcare Providers: seriousbroker.it  This test is not yet approved or cleared by the [place]  FDA and has been authorized for detection and/or diagnosis of SARS-CoV-2 by FDA under an Emergency Use Authorization (EUA). This EUA will remain in effect (meaning this test can be used) for the duration of the COVID-19 declaration under Section 564(b)(1) of the Act, 21 U.S.C. section 360bbb-3(b)(1), unless the authorization is terminated or revoked.  Performed at [facility], [address] [place], [place] [address]      Labs: BNP (last 3 results) No results for input(s): BNP in the last 8760 hours. Basic Metabolic Panel: Recent Labs  Lab 04/18/24 1122 04/19/24 0353  NA 136 133*  K 4.2 4.2  CL 99 98  CO2 25 26  GLUCOSE 150* 108*  BUN 23* 19  CREATININE 1.13 0.95  CALCIUM 9.4 8.1*   Liver Function Tests: Recent Labs  Lab 04/18/24 1240  AST 96*  ALT 150*  ALKPHOS 130*  BILITOT 0.7  PROT 6.3*  ALBUMIN 3.8   No results for input(s):  LIPASE, AMYLASE in the last 168 hours. No results for input(s): AMMONIA in the last 168 hours. CBC: Recent Labs  Lab 04/18/24 1122 04/18/24 2049 04/19/24 0353  WBC 1.1* 1.8* 1.3*  NEUTROABS  --  0.1*  --   HGB 6.1* 6.3* 7.8*  HCT 16.5* 16.8* 21.1*  MCV 82.1 77.8* 79.3*  PLT 93* 78* 74*   Cardiac Enzymes: No results for input(s): CKTOTAL, CKMB, CKMBINDEX, TROPONINI in the last 168 hours. BNP: Invalid input(s): POCBNP CBG: No results for input(s): GLUCAP in  the last 168 hours. D-Dimer No results for input(s): DDIMER in the last 72 hours. Hgb A1c No results for input(s): HGBA1C in the last 72 hours. Lipid Profile No results for input(s): CHOL, HDL, LDLCALC, TRIG, CHOLHDL, LDLDIRECT in the last 72 hours. Thyroid  function studies No results for input(s): TSH, T4TOTAL, T3FREE, THYROIDAB in the last 72 hours.  Invalid input(s): FREET3 Anemia work up Recent Labs    04/18/24 1122 04/18/24 1240  VITAMINB12  --  341  FOLATE  --  14.7  FERRITIN  --  >7,500*  TIBC  --  272  IRON  --  233*  RETICCTPCT <0.4*  --    Urinalysis No results found for: COLORURINE, APPEARANCEUR, LABSPEC, PHURINE, GLUCOSEU, HGBUR, BILIRUBINUR, KETONESUR, PROTEINUR, UROBILINOGEN, NITRITE, LEUKOCYTESUR Sepsis Labs Recent Labs  Lab 04/18/24 1122 04/18/24 2049 04/19/24 0353  WBC 1.1* 1.8* 1.3*   Microbiology Recent Results (from the past 240 hours)  Blood Culture (routine x 2)     Status: None   Collection Time: 04/09/24  4:20 PM   Specimen: BLOOD  Result Value Ref Range Status   Specimen Description BLOOD BLOOD RIGHT FOREARM  Final   Special Requests   Final    BOTTLES DRAWN AEROBIC AND ANAEROBIC Blood Culture adequate volume   Culture   Final    NO GROWTH 5 DAYS Performed at [facility], [address], [place], [place] [address]    Report Status 04/14/2024 FINAL  Final  Blood Culture (routine x 2)     Status:  None   Collection Time: 04/09/24  4:25 PM   Specimen: BLOOD  Result Value Ref Range Status   Specimen Description BLOOD BLOOD LEFT HAND  Final   Special Requests   Final    BOTTLES DRAWN AEROBIC AND ANAEROBIC Blood Culture adequate volume   Culture   Final    NO GROWTH 5 DAYS Performed at [facility], [address] [place], [place] [address]    Report Status 04/14/2024 FINAL  Final  Resp panel by RT-PCR (RSV, Flu A&B, Covid) Anterior Nasal Swab     Status: None   Collection Time: 04/18/24 12:40 PM   Specimen: Anterior Nasal Swab  Result Value Ref Range Status   SARS Coronavirus 2 by RT PCR NEGATIVE NEGATIVE Final    Comment: (NOTE) SARS-CoV-2 target nucleic acids are NOT DETECTED.  The SARS-CoV-2 RNA is generally detectable in upper respiratory specimens during the acute phase of infection. The lowest concentration of SARS-CoV-2 viral copies this assay can detect is 138 copies/mL. A negative result does not preclude SARS-Cov-2 infection and should not be used as the sole basis for treatment or other patient management decisions. A negative result may occur with  improper specimen collection/handling, submission of specimen other than nasopharyngeal swab, presence of viral mutation(s) within the areas targeted by this assay, and inadequate number of viral copies(<138 copies/mL). A negative result must be combined with clinical observations, patient history, and epidemiological information. The expected result is Negative.  Fact Sheet for Patients:  bloggercourse.com  Fact Sheet for Healthcare Providers:  seriousbroker.it  This test is no t yet approved or cleared by the [place]  FDA and  has been authorized for detection and/or diagnosis of SARS-CoV-2 by FDA under an Emergency Use Authorization (EUA). This EUA will remain  in effect (meaning this test can be used) for the duration of the COVID-19 declaration  under Section 564(b)(1) of the Act, 21 U.S.C.section 360bbb-3(b)(1), unless the authorization is terminated  or  revoked sooner.       Influenza A by PCR NEGATIVE NEGATIVE Final   Influenza B by PCR NEGATIVE NEGATIVE Final    Comment: (NOTE) The Xpert Xpress SARS-CoV-2/FLU/RSV plus assay is intended as an aid in the diagnosis of influenza from Nasopharyngeal swab specimens and should not be used as a sole basis for treatment. Nasal washings and aspirates are unacceptable for Xpert Xpress SARS-CoV-2/FLU/RSV testing.  Fact Sheet for Patients: bloggercourse.com  Fact Sheet for Healthcare Providers: seriousbroker.it  This test is not yet approved or cleared by the [place]  FDA and has been authorized for detection and/or diagnosis of SARS-CoV-2 by FDA under an Emergency Use Authorization (EUA). This EUA will remain in effect (meaning this test can be used) for the duration of the COVID-19 declaration under Section 564(b)(1) of the Act, 21 U.S.C. section 360bbb-3(b)(1), unless the authorization is terminated or revoked.     Resp Syncytial Virus by PCR NEGATIVE NEGATIVE Final    Comment: (NOTE) Fact Sheet for Patients: bloggercourse.com  Fact Sheet for Healthcare Providers: seriousbroker.it  This test is not yet approved or cleared by the [place]  FDA and has been authorized for detection and/or diagnosis of SARS-CoV-2 by FDA under an Emergency Use Authorization (EUA). This EUA will remain in effect (meaning this test can be used) for the duration of the COVID-19 declaration under Section 564(b)(1) of the Act, 21 U.S.C. section 360bbb-3(b)(1), unless the authorization is terminated or revoked.  Performed at [facility], [address] [place], [place] [address]      Time coordinating discharge: 32 min  SIGNED: Lorane Poland, DO [facility] 04/19/2024, 2:28 PM Pager   If 7PM-7AM, please contact night-coverage     [1] No Known Allergies

## 2024-04-20 LAB — CBC WITH DIFFERENTIAL/PLATELET
Abs Immature Granulocytes: 0 K/uL (ref 0.00–0.07)
Basophils Absolute: 0 K/uL (ref 0.0–0.1)
Basophils Relative: 0 %
Eosinophils Absolute: 0 K/uL (ref 0.0–0.5)
Eosinophils Relative: 0 %
HCT: 20.6 % — ABNORMAL LOW (ref 39.0–52.0)
Hemoglobin: 7.6 g/dL — ABNORMAL LOW (ref 13.0–17.0)
Immature Granulocytes: 0 %
Lymphocytes Relative: 87 %
Lymphs Abs: 0.6 K/uL — ABNORMAL LOW (ref 0.7–4.0)
MCH: 29.3 pg (ref 26.0–34.0)
MCHC: 36.9 g/dL — ABNORMAL HIGH (ref 30.0–36.0)
MCV: 79.5 fL — ABNORMAL LOW (ref 80.0–100.0)
Monocytes Absolute: 0.1 K/uL (ref 0.1–1.0)
Monocytes Relative: 8 %
Neutro Abs: 0 K/uL — CL (ref 1.7–7.7)
Neutrophils Relative %: 5 %
Platelets: 66 K/uL — ABNORMAL LOW (ref 150–400)
RBC: 2.59 MIL/uL — ABNORMAL LOW (ref 4.22–5.81)
RDW: 14.5 % (ref 11.5–15.5)
Smear Review: NORMAL
WBC: 0.7 K/uL — CL (ref 4.0–10.5)
nRBC: 0 % (ref 0.0–0.2)

## 2024-04-20 LAB — COMPREHENSIVE METABOLIC PANEL WITH GFR
ALT: 140 U/L — ABNORMAL HIGH (ref 0–44)
AST: 117 U/L — ABNORMAL HIGH (ref 15–41)
Albumin: 3.5 g/dL (ref 3.5–5.0)
Alkaline Phosphatase: 125 U/L (ref 38–126)
Anion gap: 11 (ref 5–15)
BUN: 18 mg/dL (ref 6–20)
CO2: 24 mmol/L (ref 22–32)
Calcium: 8.5 mg/dL — ABNORMAL LOW (ref 8.9–10.3)
Chloride: 96 mmol/L — ABNORMAL LOW (ref 98–111)
Creatinine, Ser: 0.93 mg/dL (ref 0.61–1.24)
GFR, Estimated: >60 mL/min (ref >60)
Glucose, Bld: 163 mg/dL — ABNORMAL HIGH (ref 70–99)
Potassium: 3.7 mmol/L (ref 3.5–5.1)
Sodium: 131 mmol/L — ABNORMAL LOW (ref 135–145)
Total Bilirubin: 0.9 mg/dL (ref 0.0–1.2)
Total Protein: 6.4 g/dL — ABNORMAL LOW (ref 6.5–8.1)

## 2024-04-20 NOTE — Discharge Instructions (Signed)
 Food Resources  Agency Name: [facility] Address: [address], [place], [place] [address] Phone: [phone] Website: [email or web address] Service(s) Offered: Housing services, self-sufficiency, congregate meal program, weatherization program, Event organiser program, emergency food assistance,  housing counseling, home ownership program, wheels - to work program.  [employer] free for 60 and older at various locations from [employer], Monday-Friday:  [employer], [address] [place], [phone] -[facility], [address], [place] [phone]  -[facility], [address] [place] [phone]  -[address], [address], [place], [phone]  Agency Name: [employer] Address: [phone] [address], [address], [place], [place] [address] Phone: [phone] Website: [email or web address] Service(s) Offered: Home delivered hot, frozen, and emergency  meals. Grocery assistance program which matches  volunteers one-on-one with seniors unable to grocery shop  for themselves. Must be 60 years and older; less than 20  hours of in-home aide service, limited or no driving ability;  live alone or with someone with a disability; live in  [place].  Agency Name: [employer] [facility] [employer]) Address: [address], [place], [place] [address] Phone: [phone] Service(s) Offered: Food is served to shut-ins, homeless, elderly, and low income people in the community every Saturday (11:30 am-12:30 pm) and Sunday (12:30 pm-1:30pm). Volunteers also offer help and encouragement in seeking employment,  and spiritual guidance.  Agency Name: [employer] Address: [address] [place], [place] [address] Phone: [phone] Service(s) Offered: Child support services; child welfare services; food stamps; Medicaid; work first family assistance; and aid  with fuel,  rent, food and medicine.  Agency Name: [employer] Address: [address], [place], [place] Phone: [phone] Website: [email or web address] Services Offered: Monday 10:00am-12:00, 8:00pm-9:00pm, and Friday 10:00am-12:00.  Agency Name: [employer] [facility] Address: [address] [place], [place] [address] Phone: [phone] Website: [email or web address] Service(s) Offered: Serves weekday meals, open from 11:30 am- 1:00 pm., and 6:30-7:30pm, Monday-Wednesday-Friday distributes food 3:30-6pm, Monday-Wednesday-Friday.  Agency Name: [facility] Address: [address], [place], [place] Phone: [phone] Website: [email or web address] Services Offered: Distributes food the 4th Saturday of the month, starting at 8:00 am  Agency Name: [facility] Address: [phone] [place], [place] [address] Phone: [phone] Website: [email or web address] Service(s) Offered: [employer] life, weekly food pantry. Open Wednesdays from 10:00am-noon.  Agency Name: [facility] [employer] Address: [address] [place], [place], [place] Phone: [phone] Services Offered: Distributes food 9am-1pm, Monday-Thursday. Call for details.  Agency Name: [facility] Address: [address] [place], [place] [address] Phone: [phone] Website: [email or web address] Service(s) Offered: Games developer. Call for assistance.  Agency Name: [facility] Address: [address], [place], [place] [address] Phone: [phone] Service Offered: Emergency Food Pantry. Call for appointment.  Agency Name: [facility] Address: [address], [place], [place] [address] Phone: [phone] Website: [email or web address] Services Offered: Games developer. Call for details  Agency Name: [facility] Address: [address] [place], [place] Phone:  [phone] Website: [email or web address] Service(s) Offered: Emergency Food Pantry. Call for details.  Agency Name: [employer] Address: [address], [place], [place] [address] Phone: [phone] or [phone] Website: [email or web address] Service(s) Offered: Distribute food 9am-11:30 am, Tuesday-Friday, and 1-3:30pm, Monday-Friday. Food pantry Monday-Friday 1pm-3pm, fresh items, Mon.-Wed.-Fri.  Agency Name: [facility] Address: [address] [place], [place] [address] Phone: [phone] Website: [email or web address] Services Offered: Distribute food Tues and Sats from 9:00am-noon.  Closed 1st Saturday of each month. Call for details  Agency Name: [employer] Address: Fayrene Boatman [facility] [address], [place] [address] Phone: [phone]  Services Offered: Delivers meals every Thursday

## 2024-04-20 NOTE — Progress Notes (Signed)
 Hand off report given to [facility] nurse for patient. Ivs remain in patient to be transporting via [facility] to room 4803 4 Oncology, per RN request. Discharge instructions with medical necessity forms in packet for [facility] for receiving facility. Patient belongings transporting with patient.

## 2024-04-20 NOTE — TOC CM/SW Note (Signed)
 Transition of Care [facility]) - Inpatient Brief Assessment   Patient Details  Name: Ronald Montoya MRN: [id number] Date of Birth: [birth year]  Transition of [facility]) CM/SW Contact:    Lauraine JAYSON Carpen, LCSW Phone Number: 04/20/2024, 9:15 AM   Clinical Narrative: Patient has orders to transfer to [facility]. CSW reviewed chart. SDOH flag for food insecurity. CSW added resources to AVS and asked RN to reprint it. No further concerns. CSW signing off.  Transition of Care Asessment: Insurance and Status: Insurance coverage has been reviewed Patient has primary care physician: Yes Home environment has been reviewed: Single family home Prior level of function:: Not documented Prior/Current Home Services: No current home services Social Drivers of Health Review: SDOH reviewed interventions complete Readmission risk has been reviewed: Yes Transition of care needs: no transition of care needs at this time

## 2024-04-21 LAB — BPAM RBC
Blood Product Expiration Date: 202512242359
Blood Product Expiration Date: 202512292359
Blood Product Expiration Date: 202601052359
ISSUE DATE / TIME: 202512151410
ISSUE DATE / TIME: 202512152204
Unit Type and Rh: 5100
Unit Type and Rh: 5100
Unit Type and Rh: 600

## 2024-04-21 LAB — TYPE AND SCREEN
ABO/RH(D): A POS
Antibody Screen: NEGATIVE
Unit division: 0
Unit division: 0
Unit division: 0

## 2024-04-21 LAB — PREPARE RBC (CROSSMATCH)

## 2024-04-29 LAB — URIC ACID
